# Patient Record
Sex: Female | Born: 1985 | Race: White | Hispanic: No | Marital: Single | Smoking: Current every day smoker
Health system: Southern US, Community
[De-identification: ages and names within clinical notes are randomized; demographics above are authoritative.]

## PROBLEM LIST (undated history)

## (undated) DIAGNOSIS — N83209 Unspecified ovarian cyst, unspecified side: Secondary | ICD-10-CM

## (undated) DIAGNOSIS — G43909 Migraine, unspecified, not intractable, without status migrainosus: Secondary | ICD-10-CM

---

## 2007-05-30 ENCOUNTER — Emergency Department (HOSPITAL_COMMUNITY): Admission: EM | Admit: 2007-05-30 | Discharge: 2007-05-31 | Payer: Self-pay | Admitting: Emergency Medicine

## 2008-07-29 ENCOUNTER — Emergency Department (HOSPITAL_COMMUNITY): Admission: EM | Admit: 2008-07-29 | Discharge: 2008-07-29 | Payer: Self-pay | Admitting: Emergency Medicine

## 2008-08-01 ENCOUNTER — Encounter: Payer: Self-pay | Admitting: Emergency Medicine

## 2008-08-01 ENCOUNTER — Inpatient Hospital Stay (HOSPITAL_COMMUNITY): Admission: EM | Admit: 2008-08-01 | Discharge: 2008-08-04 | Payer: Self-pay | Admitting: Otolaryngology

## 2008-10-19 ENCOUNTER — Emergency Department (HOSPITAL_COMMUNITY): Admission: EM | Admit: 2008-10-19 | Discharge: 2008-10-19 | Payer: Self-pay | Admitting: Emergency Medicine

## 2010-05-08 LAB — CBC
Hemoglobin: 14.5 g/dL (ref 12.0–15.0)
MCHC: 34.4 g/dL (ref 30.0–36.0)
MCV: 88.7 fL (ref 78.0–100.0)
RBC: 4.74 MIL/uL (ref 3.87–5.11)
RDW: 13 % (ref 11.5–15.5)

## 2010-05-08 LAB — DIFFERENTIAL
Basophils Absolute: 0 10*3/uL (ref 0.0–0.1)
Basophils Relative: 0 % (ref 0–1)
Eosinophils Absolute: 0 10*3/uL (ref 0.0–0.7)
Monocytes Absolute: 1.3 10*3/uL — ABNORMAL HIGH (ref 0.1–1.0)
Monocytes Relative: 8 % (ref 3–12)

## 2010-05-08 LAB — POCT I-STAT, CHEM 8
Calcium, Ion: 1.12 mmol/L (ref 1.12–1.32)
Glucose, Bld: 91 mg/dL (ref 70–99)
HCT: 43 % (ref 36.0–46.0)
Hemoglobin: 14.6 g/dL (ref 12.0–15.0)
TCO2: 23 mmol/L (ref 0–100)

## 2010-05-08 LAB — RAPID URINE DRUG SCREEN, HOSP PERFORMED
Barbiturates: NOT DETECTED
Benzodiazepines: NOT DETECTED

## 2010-05-09 LAB — RAPID STREP SCREEN (MED CTR MEBANE ONLY): Streptococcus, Group A Screen (Direct): POSITIVE — AB

## 2010-06-14 NOTE — Group Therapy Note (Signed)
NAME:  Diane Fletcher, Diane Fletcher             ACCOUNT NO.:  [id number]   MEDICAL RECORD NO.:  [id number]          PATIENT TYPE:  INP   LOCATION:  5148                         FACILITY:  [facility]   PHYSICIAN:  Dorna Leitz, M.D., F.A.C.S.DATE OF BIRTH:  Jan 03, 1986                                 PROGRESS NOTE   SUBSEQUENT HOSPITAL CARE   Diagnosis:  1.) Acute strep tonsillitis with left peritonsillar abscess/phlegmon  2.) Acute anxiety disorder  3.) Alcohol abuse  4.) Tobacco abuse  5.) Abnormal auditory perception with significant family history of  hearing loss  6.) Significant social issues with court date pending   INTERVAL HISTORY/>  The patient demonstrated sudden onset of anxiety, panic, and possible  claustrophobia in the room on a couple of occasions earlier today.  This  resulted when she was told that she could not walk off the floor.  She  was further informed at my direction that this would require her leaving  against medical advice.  At some point, she opened her window to put up  the storm window on the outside.  She then put her head out the window,  but denies any suicidal ideation or any plan to jump out the window.  She then put the screen down.  She reports that she just needed fresh  air and sunshine.  She denies any depression.  She is waiting on her  father and friends visit and denies any wish to smoke tobacco, but would  like to go out in the air.   She reports decrease in her sore throat, dysphagia, and a decrease in  tenderness of the left zone II enlarged lymph node.   PHYSICAL EXAMINATION:  Vital signs, July 4, 2:00 p.m., temperature 98.5, patient 82,  respirations 20, blood pressure 125/70, O2 sat 99% on room air and that  was just upon the initial physical exam.  Initially appeared angry, but  then tearful at the end of the discussion about her issues by me.  She  demonstrates a normal voice.  No hot potato voice.  Nose, no exudate.  Oral cavity soft palate  on left with decreased edema - palpation  revealed very soft and nonfluctuant and nonfirm soft palate. The left  tonsil is diminished in size and more easily visualized.  No exudates  are present on either tonsil.  Left tonsil 3+ without erythema, it  demonstrates a large superior left tonsillar crypt.  The right tonsil is  1+ and normal.  The uvula is midline where as it was deviated to the  right last night.  The neck is without any adenopathy or tenderness,  which is improved from last night.   ASSESSMENT/PLAN:  1. Acute strep tonsillitis with left peritonsillar abscess (phlegmon)      and left reactive lymphadenitis, zone II.  This condition is much      improved, and continue Unasyn through day 2 with plan to discharge.  2. Up in chair.  Plan to ambulate off the floor with company.  Advised      not to smoke.  3. No evidence of suicidal ideation.  For  panic and anxiety response,      advised that she may have Ativan upon requesting also for insomnia.      The patient not interested in this medication at the present time.      She was advised not to open the window or place the bottle apart      outside the window.  4. Discharged after Audiology evaluation in the morning for underlying      hearing loss and significant family history of hearing loss.      Further, social worker assistance for services such as primary      physician care and other.  5. Nicoderm patch removed secondary to patient's concern with skin      burning.  She denies feeling nicotine withdrawal symptoms and does      not wish for any replacement at this time.  She was advised to ask      for nicotine gum if withdrawal starts.      Dorna Leitz, M.D., F.A.C.S.  Electronically Signed     SJ/MEDQ  D:  08/02/2008  T:  08/03/2008  Job:  [id number]   cc:   Dr. Sallee Lange  [facility]  Department of Otolaryngology

## 2010-06-14 NOTE — H&P (Signed)
NAME:  Diane Fletcher, Diane Fletcher             ACCOUNT NO.:  [id number]   MEDICAL RECORD NO.:  [id number]          PATIENT TYPE:  EMS   LOCATION:  ED                           FACILITY:  [facility]   PHYSICIAN:  Dorna Leitz, M.D., F.A.C.S.DATE OF BIRTH:  [birth year]   DATE OF ADMISSION:  08/01/2008  DATE OF DISCHARGE:                              HISTORY & PHYSICAL   REASON FOR ADMISSION:  Acute tonsillitis, peritonsillar  abscess/phlegmon, dehydration, acute lymphadenitis.   HISTORY:  This patient is a 25 year old female with a 4 day history of a  sore throat which has been more significant on the left but still is  present on the right.  She was evaluated in the emergency room on July 29, 2008, and found to be Rapid Strep positive.  She was treated with  1.2 million units of IM Bicillin-LA.  She was given hydrocodone with  instructions and information regarding reasons for return (indication of  progressive infection).  She returns this morning to [facility]  emergency room about 6 a.m. by private transport with development over  the night with increasing dysphagia for own secretions, odynophagia, new  development of hoarseness/gravelly voice and left infra-auricular  tenderness but no definite left otalgia.  There is also tightening of  the muscles of mastication (by her indication) which she notices with  opening her mouth.  This is a new development.  She is still able to  open her mouth to normal limits.  She denies dental pain although she  did have two left mandibular molars extracted at some point in the not  too distant past.  There is no facial pain, however, she does notice  some nasal what is described as velopharyngeal insufficiency with  regurgitation of liquids out her nose after p.o. intake of fluids.  This  has developed over the past 24 hours.  She notices this occurrence after  the fluid goes down to the mid throat level and then comes back out of  her nose.  She has to lean  forward for expression of this fluid out of  her nose.  There was no solid or soft intake and has not been any for  the last 3 days.  There is a significant decrease in fluid intake over  the past 2 days.  There is no definite fever but she does report chills  at night.  She denies any previous history of recurrent strep or  otherwise tonsillitis or history of peritonsillar abscess.  There are no  sick contacts.  No abdominal pain, nausea or vomiting.  She denies  shortness of breath or heart palpitations though she did experience  about 5 minutes of left sterno-costal tenderness this a.m. which  resolved quickly.  This is her only occurrence of this symptom.  No  wheezing.   PAST MEDICAL HISTORY:  1. Questionable recent history of left ovarian mass - she was seen      in the emergency room on May 30, 2008.  No followup has occurred.      She reports trying to get pregnant for the last 2 years  with her      boyfriend but is not able to do so.  She reports that she was told      that she could not get pregnant.  She was diagnosed with      Trichomonas vaginitis in the past.  2. History of migraines - increased while in school but now      experiences approximately 1 per month or 1 every other month.  She      does have a strong family history in both her mother and brother.  3. No prior hospitalizations and otherwise healthy.   PAST SURGICAL HISTORY:  None known.   ALLERGIES:  No known drug allergies.   MEDICATIONS:  Lortab elixir, regular saline gargles.   SOCIAL HISTORY:  1. Positive tobacco 1 pack per day for the last 7 years (started age      51).  2. Positive marijuana since 25 years of age.  She uses once daily      since she began using.  Reports stopping a few days ago.  3. Alcohol use - one fifth of gin per week and about 12 packs of beer      per week for many years.  She goes out at night and drinks      approximately 2 nights per week with her girlfriends.  4. She has  a history of positive intranasal cocaine use which she      reports using for about 1 year between ages 46 and 61.  She denies      any prior use of IV drug abuse.  5. Sexual history.  She has an ex-boyfriend which she experiences      unprotected sex.  The last intercourse was 1 week ago.  She has      been trying to get pregnant with him.  She reports approximately 10      sexual partners in her life and having some unprotected sex with      others.  6. Education.  Dropped out of high school in 9th grade.  She is      planning to get her GED from [employer] - she plans to start in the fall.      She is proceeding with getting GED through [employer] who      will assist her in post GED employment.  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  7. Other history.  She moved from [place], [place], 4-5 years ago.  She      lives with her brother, sister and father.  She has no children and      has never been married.  Recent stressor involved a brother who has      a 74-week-old infant who is new to the house - she reports that this      is why she dropped out of her planned first quarter at [employer] in the      GED program.  8. She is unemployed.  When she tested positive for Trichomonas about      2 years ago she reports also getting full labs for sexually      transmitted diseases.  She knows that she tested negative for      syphilis, HIV and other pathogens.  9. Current problems with the legal system - March 11, 2008, she was      connected with a questionable purchase of stolen goods from a pawn  shop in [place].  She spent 1 month in the county jail followed      by 1 month in [facility].  She was discharged      without parole restrictions and has completed her incarceration.      Police action at a hotel 1 month ago when she was involved in      illegal act.  She reports not knowing that she would be involved in      this.  She was involved in girl-girl sex  act with men observation.      She further notes that the police crashed into the room with arrest      and she was not aware of this being illegal event.  She is      scheduled for a court hearing on August 19, 2008.   FAMILY HISTORY:  Mother alive and well at 77 years of age and suffers  from migraines.  Father alive at 44 years of age, family history of  deafness.  Sister age 35 has had heavy periods but no known history of  hemophilia or bleeding disorder in the family.  Her sister is deaf in  one ear which she reports occurred as a young child around 30 years of  age as a result of a severe ear infection.  Her brother is 71 years old  and suffers from severe migraines.  No known hearing loss.   HEARING LOSS HISTORY:  Paternal grandmother and grandfather are both  deaf.  She reports that relatives on her father's side were born deaf  and mute.  Her parents have normal hearing.  Her other individuals on  the paternal side also have problems with hearing.   REVIEW OF SYSTEMS:  ENT:  As per HPI.  No fever, no known bleeding or  bruising history/hemophilia though the patient is with intermittent  heavy periods.  She has positive several year history of difficulty  hearing.  Best hearing is noted looking at those speaking to her.  She  denies dizziness or progression in hearing loss which has been present  for several years.  No history of otitis media in her life nor any  history of loud noise exposure.  Denies shooting a gun.  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  Her hearing  does not get worse when she is in crowds.  JOINT SYSTEM:  She reports a  1 month history of bilateral severe foot pain which limits her standing  to about 2 minutes and then she has to sit down due to the pain.  She  denies numbness or tingling.  No lower extremity swelling or leg pain.  No calf pain.  She also reports a 4 year history of back pain which is  chronic and of unknown  cause.  There is no history of injury.  She has  never sought evaluation for this symptom.  There is no vision change or  problems with her vision.  ALLERGIES:  No history of allergy or nasal  complaints or drainage.  SKIN:  No lesions or rashes.  Tattoo on lower  back and fingers.  CARDIAC:  No heart palpitations or chest pain.  PULMONARY:  No breathing problems, no past or current history of  wheezing.  No development of wheezing or shortness of breath with this  illness.  There has never been any problem getting the air  in/inspiration.  ABDOMINAL:  No  abdominal symptoms except hunger.  GYN:  No current gyn problems except of failure to conceive which she has been  trying to do for 2 years, questionable history of endometriosis.  Positive cyst/mass on the left side.  History of heavy menstrual  periods.  No heartburn, stomach problems or baseline dysphagia.  PSYCHOLOGIC:  No depression or anxiety.  Recent problems with the legal  system.   PHYSICAL EXAMINATION:  VITAL SIGNS:  Temperature 97.8, pulse 96,  respirations 20, blood pressure 135/91, 99% oxygen saturation on room  air.  CONSTITUTIONAL:  My evaluation was after 10 mg of Decadron.  She was  having difficulty swallowing her own secretions and had muffled voice  which is no longer present.  She reports that when she came in her pain  was 10+/10 but is currently 5/10.  She thinks that she can drink and is  feeling much better but there is left upper neck discomfort to  palpation.  EYES:  Extraocular movements are intact.  Pupils equal, round and  reactive to light.  No lid edema or erythema, no scleral erythema.  NOSE:  Clear without exudates.  No erythema of the nasal dorsum or ala.  ORAL CAVITY:  Left tonsil enlarged to midline with moderate erythema but  no exudates.  Moderate soft palate edema which is soft to palpation  except over the inferior tonsil.  Moderate palate prolapse and coverage  over the left tonsil but not  complete.  There is slight shift of the  uvula to the right.  The right tonsil is 2+ without exudate and very  mild erythema.  She demonstrates a normal voice and handling of  secretions.  There is no trismus present.  There is a 3 cm incisural  space.  Normal voice.  Mild gravelly quality to the voice.  No  inspiratory stridor.  Normal posterior pharyngeal wall which is easily  visualized.  No edema of the posterior pharyngeal wall.  No  cobblestoning.  HEENT:  Curly hair which is pulled back.  Fair overall physical  appearance.  Dull right tympanic membrane with questionable fluid.  Nontransparent.  Tympanic membrane which is slightly retracted.  No  granulation tissue or retraction.  No cerumen in the ear canal.  Left  ear tympanic membrane opaque but no definite fluid.  No retraction.  NECK:  Tender left zone 2 angle of the mandible, diffuse enlargement but  no overlying skin change.  This area is 2 cm.  No discrete adenopathy.  No thyromegaly.  No thyroid masses or nodules appreciated.  No stridor,  no carotid bruits, carotids very difficult to auscultate.  CARDIAC:  A fairly audible murmur with obliteration of S1.  This is a  systolic ejection murmur grade 1.  There is a normal rate which is  approximately 90.  The very mild murmur is loudest over the aortic and  mitral valves.  LUNGS:  Clear to auscultation.  No wheezes, rhonchi.  All fields clear.  ABDOMEN:  Obese, soft without hepatosplenomegaly to palpation.  No  rebound.  No tenderness.  Positive bowel sounds without hypo or hyper  activity.  EXTREMITIES:  Normal pulses x4.  No edema, no cyanosis.  Good muscle  development and strength.  No calf edema.  No foot erythema or  abnormality.  NEUROLOGICAL:  Cranial nerves II-XII intact.  Eighth nerve - responsive  normally to normal voice levels.  Cerebellar  - normal gait.   LABORATORY STUDIES:  White blood cell count 16.6 compared  to 14.3 on  July 29, 2008.  Hemoglobin 14.5,  platelets 248, without segmental white  blood cells 82% without bands present.  No atypical lymphocytes.  Sodium  136, potassium 3.7, chloride 102, BUN 23, creatinine 0.8 and glucose 91.  Rapid Strep today negative but was positive on June 30.  CT scan with  contrast of the neck and upper chest including lung fields.  This was  personally reviewed by me today.  Findings include 1) 15.6 x 8.3 mm left  inferomedial tonsil and peritonsillar phlegmon.  The edema and  enlargement extends to the left piriform sinus and larynx.  It involves  the retropharynx mildly.  No definite abscess or phlegmon in the  retropharynx; 2) 19.9 left zone 2 node without lucency but moderate  enlargement; 3) there is a second area of lucency approximately 1 cm on  the left medial surface against the mandibular body.  This is consistent  with a small phlegmon.  There is no ring enhancement of either low  density area and this reveals medium density and not low density.  The  findings are not quite consistent of full liquid abscess.   ASSESSMENT:  1. Strep/nonstrep tonsillitis with moderately progressed left      peritonsillar abscess/phlegmon.  This has progressed despite      Bicillin 4 days prior.  The symptoms and findings are not quite      indicative of a full abscess.  However, there is an elevated white      blood cell count.  She has recently responded to steroids this      morning.  2. Reactive left zone 2 lymphadenitis.  3. Dehydration.  4. Significant other social and medical management needs and issues.  5. Alcohol, tobacco, cannabis abuse, cannabis use in the past.  No IV      drug abuse.  6. Paternal grandparent and the family on this side deafness and have      been born mute.  The patient with subjective long term hearing      loss.  She will need further evaluation and workup.  7. Lacks primary and gyn physician care.   RECOMMENDATIONS:  1. Admit and treat as well as observation for  resolution/progression      of current tonsillar process.  She already has a normal voice and      fluid intake after Decadron.  She will be admitted to the [facility] Emergency Dept surgical/ENT floor 5500.  She will be transported by [facility]      with normal monitoring.  2. IV Unasyn for broader coverage if there are possible other      pathogenic organisms/bacteria present.  Possible repeat of Decadron     if necessary.  3. Hydration.  4. Saline gargles land Peridex gargles.  5. Tap into general care physicians, physician plans and clinic.  6. Smoking cessation.  7. Plan for hearing evaluation and workup.  8. Pain control.  9. One hour spent in counseling and evaluation.  There is a moderate      decision making level in this history and physical.  Brief ER note      dictated [id number].      Dorna Leitz, M.D., F.A.C.S.  Electronically Signed     SJ/MEDQ  D:  08/01/2008  T:  08/01/2008  Job:  [id number]   cc:   Jacalyn Lefevre  Fax: [phone]

## 2010-06-14 NOTE — Consult Note (Signed)
NAME:  Diane Fletcher, Diane Fletcher             ACCOUNT NO.:  [id number]   MEDICAL RECORD NO.:  [id number]          PATIENT TYPE:  EMS   LOCATION:  ED                           FACILITY:  [facility]   PHYSICIAN:  Dorna Leitz, M.D., F.A.C.S.DATE OF BIRTH:  01/24/1986   DATE OF CONSULTATION:  08/01/2008  DATE OF DISCHARGE:                                 CONSULTATION   TIME OF CONSULTATION:  11:00 a.m.   REASON FOR CONSULTATION:  Peritonsillar abscess.   Consultation on this 25 year old female for CT proven peritonsillar  abscess and recent Strep positive rapid test requested.  Patient found  by me to require admission and for this reason, history and physical  dictated.  Full note written and dictated.  Transfer to [facility]  Surgical Floor as all Otolaryngology Surgical services/OR at [facility]  OR.      Dorna Leitz, M.D., F.A.C.S.  Electronically Signed     SJ/MEDQ  D:  08/01/2008  T:  08/01/2008  Job:  [id number]   cc:   Jacalyn Lefevre  Fax: [phone]

## 2010-06-14 NOTE — Discharge Summary (Signed)
NAME:  Diane Fletcher, Diane Fletcher             ACCOUNT NO.:  [id number]   MEDICAL RECORD NO.:  [id number]          PATIENT TYPE:  INP   LOCATION:  5148                         FACILITY:  [facility]   PHYSICIAN:  Dorna Leitz, M.D., F.A.C.S.DATE OF BIRTH:  01-Mar-1985   DATE OF ADMISSION:  08/01/2008  DATE OF DISCHARGE:  08/04/2008                               DISCHARGE SUMMARY   DISCHAGE SUMMARY   TESTS PERFORMED:  1. CT scan of the neck with contrast.  2. Audiogram with tympanometry.   DISCHARGE DIAGNOSES:  1. Acute strep tonsillitis with left peritonsillar abscess.  2. Reactive lymphadenitis.  3. Dehydration.  4. Alcohol abuse and problems with the law.  5. Mental health issues.  6. Social issues.  7. 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   HISTORY:  This patient is a 25 year old female who was admitted 3 days  ago with a few-day history of dysphasia for wound secretions and  significant sore throat.  She was seen in the Emergency Room at [facility] on July 29, 2008 with a strep test being positive.  She was treated  with 1.2 million units of IM Bicillin L-A.  She was given hydrocodone  with instructions and information regarding reasons for return.  She  returned at the day of admission with development of hoarseness as well  as the other symptoms.  She also had tenderness and swelling in the left  infra-auricular region.  There was no definite otalgia and mild trismus.  She also noticed a tightening of the muscle on mastication when she  notices with opening up her mouth.  She had had no significant intake of  fluids for the past 24 hours.  No solid food intake for 3 days and  decrease fluid intake for 2 days.  No definite fever.   On physical exam, temperature was 97.8 and she had mild garbled speech.  The oral cavity demonstrated a left tonsil that was over to the midline  with moderate erythema, but no exudates.  There was moderate soft palate  edema,  which was nonfluctuant.  It was soft over the tonsil.  Moderate  palate prolapse on the left side.  Slight shifted uvula to the right.  Right tonsil 2+.  There was a normal posterior pharyngeal wall.  The  left neck revealed a diffuse 2-cm area of zone 2 fullness with  tenderness, but no adenopathy.  White blood cell count was 16,600 with  80% segs, but no bands.  Her other lab tests were normal.  CT scan of  the neck and upper chest with lung fields was performed with contrast.  There was a left inferior medial tonsillar/peritonsillar phlegmon which  extended to the piriform sinuses of the larynx.  It involves  retropharynx.  The size was 15.6 x 8.3 mm.  The left zone 2 lymph node  measured at 19.9 mm by CT scan.  There was no lucency.  There was no  ring enhancement or significant low density.  For these reasons, the  patient was transferred to the ENT portion of the surgical floor at  [facility] for  ongoing otolaryngology management.  She was treated with  IV Unasyn until discharge.  Pain control was achieved with small amounts  of morphine and oxycodone.  She was hydrated.  While here, she was  evaluated by the social worker and case Facilities manager who were  working to meet her social medical needs.  She will be scheduled for an  appointment with family practice to assess the ovarian issue and also  general mental and other physical health concerns.  There was also a  condition of deafness in the grandparents on her father side.  The  patient demonstrates some mild-to-moderate sensorineural hearing loss,  it is symmetric.  She will follow up with [facility] as an  appointment has been made for her.  She was arranged for 7 days of  Augmentin 875 b.i.d.  She was given two copies of the audiology  evaluation to take with her.  She was discharged to home on hospital day  #4 in stable condition and feeling much better and appeared much more  psychologically adjusted.       Dorna Leitz, M.D., F.A.C.S.  Electronically Signed     SJ/MEDQ  D:  08/04/2008  T:  08/05/2008  Job:  [id number]   cc:   Jacalyn Lefevre

## 2010-06-14 NOTE — Consult Note (Signed)
NAME:  Diane Fletcher, Diane Fletcher             ACCOUNT NO.:  [id number]   MEDICAL RECORD NO.:  [id number]          PATIENT TYPE:  INP   LOCATION:                               FACILITY:  [facility]   PHYSICIAN:  Dorna Leitz, M.D., F.A.C.S.DATE OF BIRTH:  05-04-1985   DATE OF CONSULTATION:  08/04/2008  DATE OF DISCHARGE:                                 CONSULTATION   SUBSEQUENT HOSPITAL CARE   INTERVAL HISTORY:  The patient is feeling very well today and is excited  about going home.  There has been no fever.  She denies sore throat,  dysphagia, or any ear pain.   PHYSICAL EXAMINATION:  VITAL SIGNS:  Temperature 98.5 degrees F, pulse  76, respirations 18, blood pressure 108/72.  HEENT:  The outer ear is without erythema or draining exudate.  Nose, no  exudates or erythema of the overlying skin.  Oral cavity, tonsils are 2+  and with moderate cryptic change, bilaterally.  No palate edema.  No  uvula edema or shift.  NECK:  Not nontender without palpable adenopathy or mass.   ASSESSMENT/PLAN:  1.) Strep left peritonsillar abscess/phlegmon,  tonsillitis-improved, resolving.  2.) Reactive lymphadenitis-improved, resolving.  3.) Discharge to home.      Dorna Leitz, M.D., F.A.C.S.  Electronically Signed     SJ/MEDQ  D:  08/04/2008  T:  08/05/2008  Job:  [id number]   cc:   Jacalyn Lefevre

## 2010-06-14 NOTE — Consult Note (Signed)
NAME:  Diane Fletcher, Diane Fletcher             ACCOUNT NO.:  [id number]   MEDICAL RECORD NO.:  [id number]          PATIENT TYPE:  INP   LOCATION:  5148                         FACILITY:  [facility]   PHYSICIAN:  Dorna Leitz, M.D., F.A.C.S.DATE OF BIRTH:  08-20-1985   DATE OF CONSULTATION:  DATE OF DISCHARGE:  08/04/2008                                 CONSULTATION   SUBSEQUENT HOSPITAL CARE   INTERVAL HISTORY:  The patient is feeling much better today and more  relaxed after having one dose of Ativan yesterday.  She reports mild  discomfort in the left zone 2 node; however, this is markedly improved.  She is tolerating soft diet well.  We discussed the family history of  alcoholism (mother's side of the family) as well as the patient's  concern with anxiety and possibility of bipolar disorder.  She has seen  a psychologist, which she feels has been helpful with dealing with  significant family issues.  He is no longer at his practice and she has  been waiting for 2 weeks for a call with a new doctor's name.  She also  has ovarian problems and was last seen at an outside hospital in May for  ovarian concerns.  She applied for Medicaid about 2 months ago, but is  still waiting on her response.   PHYSICAL EXAMINATION:  VITAL SIGNS:  Temperature 97.9, pulse 80,  respirations 18, blood pressure 135/81, O2 sat 98% on room air.  GENERAL DESCRIPTION:  She is lying in bed and seems to be relaxed with  appropriate affect and thoughtful insight with openness to the  seriousness of her recent problems with the law and alcohol.  NOSE:  No exudates.  ORAL CAVITY:  Normal voice without drooling.  No hoarseness.  NECK:  No obvious masses or adenopathy.  There is no skin change and no  tenderness to palpation.   ASSESSMENT:  1. Left peritonsillar abscess with strep tonsillitis and reactive      lymphadenitis, resolving with Unasyn IV.  2. Mental health concerns  3. Social issues.  4. 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 Continue Unasyn with discharge in the morning.  2. 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  Referral to      vocational rehab for consideration of binaural hearing aid.  She      was advised that her 2 siblings and father need to undergo      audiograms.  The patient will be scheduled for a genetics      appointment given her history and findings on the audiogram today.  3. Augmentin.  She will need Augmentin at discharge for 7 days.  This      will be 875 mg b.i.d.  This will be provided at no charge by the      hospital.  4. Arrange for [facility] appointment for general health      concerns.  5. Psychological Counseling-Assist her with an appointment with the      group that she was seeing and will need a new psychologist as her's  has left.      Dorna Leitz, M.D., F.A.C.S.  Electronically Signed     SJ/MEDQ  D:  08/04/2008  T:  08/05/2008  Job:  [id number]   cc:   Jacalyn Lefevre

## 2010-07-05 ENCOUNTER — Emergency Department (HOSPITAL_COMMUNITY)
Admission: EM | Admit: 2010-07-05 | Discharge: 2010-07-05 | Disposition: A | Discharge status: Home / Self Care | Payer: Self-pay | Attending: Emergency Medicine | Admitting: Emergency Medicine

## 2010-07-05 DIAGNOSIS — Z4802 Encounter for removal of sutures: Secondary | ICD-10-CM | POA: Insufficient documentation

## 2010-10-25 LAB — CBC
HCT: 42.8
Hemoglobin: 14.7
Platelets: 322
RBC: 5.01
WBC: 14.3 — ABNORMAL HIGH

## 2010-10-25 LAB — URINALYSIS, ROUTINE W REFLEX MICROSCOPIC
Ketones, ur: 15 — AB
Nitrite: NEGATIVE
Specific Gravity, Urine: 1.022
Urobilinogen, UA: 1

## 2010-10-25 LAB — POCT I-STAT, CHEM 8
BUN: 5 — ABNORMAL LOW
Calcium, Ion: 1.07 — ABNORMAL LOW
Chloride: 104
Creatinine, Ser: 1
Glucose, Bld: 97
HCT: 45
Hemoglobin: 15.3 — ABNORMAL HIGH
Potassium: 3.6
Sodium: 138
TCO2: 23

## 2010-10-25 LAB — PREGNANCY, URINE: Preg Test, Ur: NEGATIVE

## 2010-10-25 LAB — DIFFERENTIAL
Eosinophils Relative: 0
Lymphocytes Relative: 17
Lymphs Abs: 2.4
Monocytes Relative: 8

## 2010-10-25 LAB — GC/CHLAMYDIA PROBE AMP, GENITAL
Chlamydia, DNA Probe: POSITIVE — AB
GC Probe Amp, Genital: POSITIVE — AB

## 2010-10-25 LAB — WET PREP, GENITAL

## 2011-06-27 MED ORDER — METRONIDAZOLE 500 MG PO TABS
500 MG | ORAL_TABLET | Freq: Two times a day (BID) | ORAL | Status: AC
Start: 2011-06-27 — End: 2011-07-04

## 2011-06-27 NOTE — Progress Notes (Signed)
Subjective:      Patient ID: Kaitlyn Day is a 26 y.o. female whose mother and MGM had ovarian cancer and both survived.  She has a history of ovarian cysts.  Menarche was age 23 x 27-32 x 7 days.  She gets cramps 4 days before her periods with breast tenderness.  She is getting an itchy discharge around her period for 4 months.  She is sexually active and doesn't use any contraception.  Partner has a 2 yo.  She'd like a pregnancy sooner than later.  She has hirsutism and irregular menses    HPI    Review of Systems   Constitutional: Negative for appetite change, fatigue and unexpected weight change.   HENT: Negative for congestion and rhinorrhea.    Eyes: Negative for visual disturbance.   Respiratory: Positive for cough (related to smoking). Negative for shortness of breath and wheezing.    Cardiovascular: Negative for chest pain and palpitations.   Gastrointestinal: Negative for abdominal pain, diarrhea and constipation.   Genitourinary: Positive for vaginal discharge (thick with itching) and menstrual problem (dysmenorrhea). Negative for dysuria, urgency and frequency.   Musculoskeletal: Positive for back pain (chronic low back). Negative for joint swelling.   Skin: Negative.  Negative for rash.        Hirsutism for 4 years.  She gets acne around her menses on her back   Neurological: Positive for headaches (migraines once weekly). Negative for dizziness.   Hematological: Does not bruise/bleed easily.   Psychiatric/Behavioral: Negative.        Objective:   Physical Exam   Constitutional: She is oriented to person, place, and time. She appears well-developed and well-nourished.   Eyes: Conjunctivae are normal.   Neck: Neck supple.   Cardiovascular: Normal heart sounds.    Pulmonary/Chest: Breath sounds normal.   Abdominal: She exhibits no mass. There is no tenderness.   Genitourinary:   Breasts nipples everted, no masses or tenderness, does BSE  Vulva-no lesions  Vagina-pink rugated  Cervix-firm, 2 cm.  Nontender, freely movable, no lesions  Uterus-ant. Smooth, firm, nontender, freely movable  Adnexa-no masses or tenderness  Pap to lab   Musculoskeletal: Normal range of motion.   Lymphadenopathy:     She has no cervical adenopathy.   Neurological: She is alert and oriented to person, place, and time.   Skin: Skin is warm and dry.   Psychiatric: She has a normal mood and affect. Her behavior is normal.       Assessment:      Screening sti's  Hirsutism  History of ovarian cysts poss PCOS  Exam is normal  Bacterial vaginosis       Plan:      HIV, RPR, LH FSH DHEAS testosterone, glucose, Hgb A1C, insulin  Metronidazole 500#14 1 twice daily  Folic acid daily  Call with test results  Consider Aldactone or Vaniqa for hirsutism   RTC 1 year and PRN

## 2011-06-30 LAB — C. TRACHOMATIS / N. GONORRHOEAE, DNA
C. trachomatis DNA: NEGATIVE
N. gonorrhoeae DNA: NEGATIVE

## 2011-06-30 LAB — GYN CYTOLOGY

## 2011-07-03 NOTE — Telephone Encounter (Signed)
Pt mailed confirmation of neg pap and GC/chlamydia results

## 2012-10-23 ENCOUNTER — Encounter (HOSPITAL_COMMUNITY): Payer: Self-pay | Admitting: *Deleted

## 2012-10-23 DIAGNOSIS — Z8742 Personal history of other diseases of the female genital tract: Secondary | ICD-10-CM | POA: Insufficient documentation

## 2012-10-23 DIAGNOSIS — F17200 Nicotine dependence, unspecified, uncomplicated: Secondary | ICD-10-CM | POA: Insufficient documentation

## 2012-10-23 DIAGNOSIS — G43909 Migraine, unspecified, not intractable, without status migrainosus: Secondary | ICD-10-CM | POA: Insufficient documentation

## 2012-10-23 NOTE — ED Notes (Signed)
Pt states migraine that has lasted for 2 days. Pt states nausea and chills as well. Pt states hx of migraines for the past 3 years.

## 2012-10-24 ENCOUNTER — Emergency Department (HOSPITAL_COMMUNITY)
Admission: EM | Admit: 2012-10-24 | Discharge: 2012-10-24 | Disposition: A | Discharge status: Home / Self Care | Payer: Self-pay | Attending: Emergency Medicine | Admitting: Emergency Medicine

## 2012-10-24 DIAGNOSIS — G43909 Migraine, unspecified, not intractable, without status migrainosus: Secondary | ICD-10-CM

## 2012-10-24 HISTORY — DX: Unspecified ovarian cyst, unspecified side: N83.209

## 2012-10-24 HISTORY — DX: Migraine, unspecified, not intractable, without status migrainosus: G43.909

## 2012-10-24 MED ORDER — DIPHENHYDRAMINE HCL 50 MG/ML IJ SOLN
25.0000 mg | Freq: Once | INTRAMUSCULAR | Status: AC
  Administered 2012-10-24: 25 mg via INTRAVENOUS
  Filled 2012-10-24: qty 1

## 2012-10-24 MED ORDER — KETOROLAC TROMETHAMINE 30 MG/ML IJ SOLN
30.0000 mg | Freq: Once | INTRAMUSCULAR | Status: AC
  Administered 2012-10-24: 30 mg via INTRAVENOUS
  Filled 2012-10-24: qty 1

## 2012-10-24 MED ORDER — METOCLOPRAMIDE HCL 5 MG/ML IJ SOLN
10.0000 mg | Freq: Once | INTRAMUSCULAR | Status: AC
  Administered 2012-10-24: 10 mg via INTRAVENOUS
  Filled 2012-10-24: qty 2

## 2012-10-24 MED ORDER — SODIUM CHLORIDE 0.9 % IV BOLUS (SEPSIS)
1000.0000 mL | Freq: Once | INTRAVENOUS | Status: AC
  Administered 2012-10-24: 1000 mL via INTRAVENOUS

## 2012-10-24 NOTE — ED Notes (Signed)
Symptoms include photophobia, pain from loud noises

## 2012-10-24 NOTE — ED Provider Notes (Signed)
CSN: [id number]     Arrival date & time 10/23/12  2217 History   First MD Initiated Contact with Patient 10/24/12 0107     Chief Complaint  Patient presents with  . Headache   (Consider location/radiation/quality/duration/timing/severity/associated sxs/prior Treatment) The history is provided by the patient. No language interpreter was used.  Diane Fletcher is a 27 y/o F with PMHx of migraines presenting to the ED with a headache that started this morning. Patient reported that the headache started off subtle and continued to get worse as the day progressed. Patient reported that she has a constant throbbing sensation to the head, entire head. Reported that she has been having this sensation that she her head feels "really big" reported that there is a "bloating" sensation to her head. Patient reported that the discomfort stays to the head without radiation. Reported that light and loud sounds make the pain worse. Patient reported that nothing makes the pain better. Patient reported that she took ASA earlier today without relief. Patient stated that she has been feeling nauseous reported that she had at least 3 episodes of emesis today - NB/NB. Patient reported that the symptoms are very similar to the symptoms that she normally gets with her migraines - reported that the symptoms that she is presenting with are the normal symptoms that she gets with her normal migraines. Reported that she gets intermittent blurred vision depending on how intense the headache is. Patient reports that she gets migraines at least once per week. Reports that she's not on any maintenance therapy. Denied worst headache of her life, thunderclap onset, numbness, tingling, weakness, chest pain, shortness of breath, difficulty breathing, sudden loss of vision, neck stiffness, fever, neck pain. PCP none  Past Medical History  Diagnosis Date  . Migraines   . Ovarian cyst    History reviewed. No pertinent past surgical  history. History reviewed. No pertinent family history. History  Substance Use Topics  . Smoking status: Current Every Day Smoker  . Smokeless tobacco: Not on file  . Alcohol Use: No   OB History   Grav Para Term Preterm Abortions TAB SAB Ect Mult Living                 Review of Systems  Constitutional: Negative for fever.  HENT: Negative for sore throat, trouble swallowing, neck pain and neck stiffness.   Eyes: Positive for photophobia and visual disturbance.  Respiratory: Negative for chest tightness and shortness of breath.   Cardiovascular: Negative for chest pain.  Gastrointestinal: Positive for nausea and vomiting. Negative for abdominal pain.  Neurological: Positive for headaches. Negative for numbness.  All other systems reviewed and are negative.    Allergies  Review of patient's allergies indicates no known allergies.  Home Medications  No current outpatient prescriptions on file. BP 118/74  Pulse 80  Temp(Src) 97.3 F (36.3 C) (Oral)  Resp 18  Ht 5\' 4"  (1.626 m)  Wt 200 lb (90.719 kg)  BMI 34.31 kg/m2  SpO2 97% Physical Exam  Nursing note and vitals reviewed. Constitutional: She is oriented to person, place, and time. She appears well-developed and well-nourished. No distress.  Patient found laying in bed sleeping  HENT:  Head: Normocephalic and atraumatic.  Mouth/Throat: Oropharynx is clear and moist. No oropharyngeal exudate.  Eyes: Conjunctivae and EOM are normal. Pupils are equal, round, and reactive to light. Right eye exhibits no discharge. Left eye exhibits no discharge.  Neck: Normal range of motion. Neck supple.  Negative neck stiffness  Negative nuchal rigidity Negative lymphadenopathy Negative pain upon palpation to the neck or cervical spine  Cardiovascular: Normal rate, regular rhythm and normal heart sounds.  Exam reveals no friction rub.   No murmur heard. Pulses:      Radial pulses are 2+ on the right side, and 2+ on the left side.        Dorsalis pedis pulses are 2+ on the right side, and 2+ on the left side.  Pulmonary/Chest: Effort normal and breath sounds normal. No respiratory distress. She has no wheezes. She has no rales.  Musculoskeletal: Normal range of motion.  Lymphadenopathy:    She has no cervical adenopathy.  Neurological: She is alert and oriented to person, place, and time. No cranial nerve deficit. She exhibits normal muscle tone. Coordination normal.  Cranial nerves II through XII grossly intact Strength 5+/5+ upper and lower extremities bilaterally, with resistance applied, equal distribution identified Negative arm drift Answered questions accordingly Follows commands well  Skin: Skin is warm and dry. She is not diaphoretic. No erythema.  Psychiatric: She has a normal mood and affect. Her behavior is normal. Thought content normal.    ED Course  Procedures (including critical care time)  4:20 AM Spoke with patient, re-assessed patient reported that headache feels a lot better. Patient reported that she is ready to go home.   Labs Review Labs Reviewed - No data to display Imaging Review No results found.  MDM   1. Migraine     Patient presenting to the emergency room with migraine that started this morning-reported that she's been having migraines for the past 3 years, reported that she gets a least one per week. When discussed about maintenance therapy patient denied being on any type of maintenance therapy. Patient reports that she usually gets nausea, vomiting, visual disturbances such as blurred vision, photophobia, phonophobia and a throbbing sensation all over her head-reported that these are her normal symptoms for migraine. Patient reports she presenting today with normal symptoms were migraine. Denies sudden onset, worse headache of life. Alert and oriented. Patient pleasant. Negative neurological deficits identified. Heart rate and rhythm normal. Lungs clear to auscultation bilaterally.  Pulses palpable and strong, proximal and distal. Full range of motion to upper lower extremities bilaterally. Strength intact. Doubt SAH. Doubt intracranial hemorrhage. Doubt intracranial abnormalities or injuries. Suspicion to be typical migraine that the patient presents with. Pain controlled in ED setting. Patient reports she is feeling better after IV fluids, Toradol, Benadryl, Reglan have been given. Patient stable, afebrile. Discharged patient with instructions to use Excedrin as needed for discomfort and for migraine relief. Discussed with patient that since these migraines are so frequent that she may need to followup regarding possible maintenance therapy. Discussed with patient to rest and stay hydrated. [facility]. Discussed with patient to continue to monitor symptoms and if symptoms are to worsen or change to report back to emergency department-strict return instructions given. Patient agreed plan of care, understood, all questions answered.    Raymon Mutton, PA-C 10/24/12 [phone]

## 2012-10-25 NOTE — ED Provider Notes (Signed)
Medical screening examination/treatment/procedure(s) were performed by non-physician practitioner and as supervising physician I was immediately available for consultation/collaboration.    Mayreli Alden R Kambree Krauss, MD 10/25/12 0340 

## 2013-04-08 ENCOUNTER — Emergency Department (HOSPITAL_COMMUNITY)
Admission: EM | Admit: 2013-04-08 | Discharge: 2013-04-08 | Disposition: A | Discharge status: Home / Self Care | Payer: Self-pay | Attending: Emergency Medicine | Admitting: Emergency Medicine

## 2013-04-08 ENCOUNTER — Encounter (HOSPITAL_COMMUNITY): Payer: Self-pay | Admitting: Emergency Medicine

## 2013-04-08 DIAGNOSIS — F17200 Nicotine dependence, unspecified, uncomplicated: Secondary | ICD-10-CM | POA: Insufficient documentation

## 2013-04-08 DIAGNOSIS — R112 Nausea with vomiting, unspecified: Secondary | ICD-10-CM | POA: Insufficient documentation

## 2013-04-08 DIAGNOSIS — H53149 Visual discomfort, unspecified: Secondary | ICD-10-CM | POA: Insufficient documentation

## 2013-04-08 DIAGNOSIS — G43909 Migraine, unspecified, not intractable, without status migrainosus: Secondary | ICD-10-CM | POA: Insufficient documentation

## 2013-04-08 LAB — CBC WITH DIFFERENTIAL/PLATELET
BASOS ABS: 0 10*3/uL (ref 0.0–0.1)
BASOS PCT: 0 % (ref 0–1)
EOS ABS: 0 10*3/uL (ref 0.0–0.7)
EOS PCT: 0 % (ref 0–5)
HCT: 37.8 % (ref 36.0–46.0)
Hemoglobin: 12.9 g/dL (ref 12.0–15.0)
LYMPHS ABS: 1.8 10*3/uL (ref 0.7–4.0)
Lymphocytes Relative: 16 % (ref 12–46)
MCH: 29.9 pg (ref 26.0–34.0)
MCHC: 34.1 g/dL (ref 30.0–36.0)
MCV: 87.5 fL (ref 78.0–100.0)
Monocytes Absolute: 0.7 10*3/uL (ref 0.1–1.0)
Monocytes Relative: 6 % (ref 3–12)
NEUTROS PCT: 78 % — AB (ref 43–77)
Neutro Abs: 8.8 10*3/uL — ABNORMAL HIGH (ref 1.7–7.7)
PLATELETS: 280 10*3/uL (ref 150–400)
RBC: 4.32 MIL/uL (ref 3.87–5.11)
RDW: 13.1 % (ref 11.5–15.5)
WBC: 11.3 10*3/uL — ABNORMAL HIGH (ref 4.0–10.5)

## 2013-04-08 LAB — BASIC METABOLIC PANEL
BUN: 7 mg/dL (ref 6–23)
CALCIUM: 9.2 mg/dL (ref 8.4–10.5)
CO2: 25 mEq/L (ref 19–32)
Chloride: 105 mEq/L (ref 96–112)
Creatinine, Ser: 0.57 mg/dL (ref 0.50–1.10)
GLUCOSE: 104 mg/dL — AB (ref 70–99)
POTASSIUM: 3.7 meq/L (ref 3.7–5.3)
SODIUM: 141 meq/L (ref 137–147)

## 2013-04-08 MED ORDER — SODIUM CHLORIDE 0.9 % IV BOLUS (SEPSIS)
1000.0000 mL | Freq: Once | INTRAVENOUS | Status: AC
  Administered 2013-04-08: 1000 mL via INTRAVENOUS

## 2013-04-08 MED ORDER — KETOROLAC TROMETHAMINE 15 MG/ML IJ SOLN
30.0000 mg | Freq: Once | INTRAMUSCULAR | Status: AC
  Administered 2013-04-08: 30 mg via INTRAVENOUS
  Filled 2013-04-08: qty 2

## 2013-04-08 MED ORDER — METOCLOPRAMIDE HCL 5 MG/ML IJ SOLN
10.0000 mg | Freq: Once | INTRAMUSCULAR | Status: AC
  Administered 2013-04-08: 10 mg via INTRAVENOUS
  Filled 2013-04-08: qty 2

## 2013-04-08 MED ORDER — SUMATRIPTAN SUCCINATE 6 MG/0.5ML ~~LOC~~ SOLN: 6.0000 mg | Freq: Once | SUBCUTANEOUS | Status: DC

## 2013-04-08 MED ORDER — DIPHENHYDRAMINE HCL 50 MG/ML IJ SOLN
25.0000 mg | Freq: Once | INTRAMUSCULAR | Status: AC
  Administered 2013-04-08: 25 mg via INTRAVENOUS
  Filled 2013-04-08: qty 1

## 2013-04-08 NOTE — ED Notes (Signed)
Pt has hx of migraine. New onset that started yesterday and pain continues. Pt reports taking Advil and oxycodone 15 mg with no relief. Pt reports n/vx3, light sensitivity and no energy. Pt alert and oriented.

## 2013-04-08 NOTE — ED Provider Notes (Signed)
CSN: [id number]     Arrival date & time 04/08/13  1910 History   First MD Initiated Contact with Patient 04/08/13 2053     Chief Complaint  Patient presents with  . Migraine     (Consider location/radiation/quality/duration/timing/severity/associated sxs/prior Treatment) Patient is a 28 y.o. female presenting with migraines. The history is provided by the patient. No language interpreter was used.  Migraine This is a new problem. The current episode started yesterday. Associated symptoms include headaches, nausea and vomiting. Pertinent negatives include no abdominal pain, chills, fever, vertigo, visual change or weakness.   Pt is a 28 year old female who presents with a migraine. She reports that she has had gradual worsening of a headache for the last two days. She endorses a history of migraines and says that she sometimes has to come in for fluids and meds. She denies any recent fever, chills or illness. She denies any confusion, weakness, visual changes, dizziness or problems walking. She does report mild photophobia. She has tried advil and oxycodone at home without relief. She reports mild nausea and had one episode of vomiting at home.    Past Medical History  Diagnosis Date  . Migraines   . Ovarian cyst    History reviewed. No pertinent past surgical history. History reviewed. No pertinent family history. History  Substance Use Topics  . Smoking status: Current Every Day Smoker -- 1.00 packs/day    Types: Cigarettes  . Smokeless tobacco: Not on file  . Alcohol Use: No   OB History   Grav Para Term Preterm Abortions TAB SAB Ect Mult Living                 Review of Systems  Constitutional: Negative for fever and chills.  Eyes: Positive for photophobia. Negative for visual disturbance.  Gastrointestinal: Positive for nausea and vomiting. Negative for abdominal pain.  Neurological: Positive for headaches. Negative for vertigo and weakness.  All other systems reviewed  and are negative.      Allergies  Review of patient's allergies indicates no known allergies.  Home Medications   Current Outpatient Rx  Name  Route  Sig  Dispense  Refill  . aspirin-acetaminophen-caffeine (EXCEDRIN MIGRAINE) 250-250-65 MG per tablet   Oral   Take 2 tablets by mouth every 6 (six) hours as needed for headache.         . ibuprofen (ADVIL,MOTRIN) 200 MG tablet   Oral   Take 600 mg by mouth every 6 (six) hours as needed for moderate pain.         . SUMAtriptan (IMITREX) 6 MG/0.5ML SOLN injection   Subcutaneous   Inject 0.5 mLs (6 mg total) into the skin once. May repeat in 2 hours if headache persists or recurs.   0.5 mL   1    BP 119/73  Pulse 88  Temp(Src) 98.2 F (36.8 C) (Oral)  Resp 22  SpO2 98%  LMP 04/05/2013 Physical Exam  Nursing note and vitals reviewed. Constitutional: She is oriented to person, place, and time. She appears well-developed and well-nourished. No distress.  Well-appearing  HENT:  Head: Normocephalic and atraumatic.  Eyes: Conjunctivae and EOM are normal. Pupils are equal, round, and reactive to light.  Neck: Normal range of motion. Neck supple. No JVD present.  Cardiovascular: Normal rate, regular rhythm, normal heart sounds and intact distal pulses.   Pulmonary/Chest: Breath sounds normal. No respiratory distress. She has no wheezes.  Abdominal: Soft. Bowel sounds are normal. She exhibits no distension.  There is no tenderness.  Musculoskeletal: Normal range of motion.  Lymphadenopathy:    She has no cervical adenopathy.  Neurological: She is alert and oriented to person, place, and time. She has normal strength. No cranial nerve deficit. She displays a negative Romberg sign. Coordination and gait normal. GCS eye subscore is 4. GCS verbal subscore is 5. GCS motor subscore is 6.  Skin: Skin is warm and dry. No rash noted.  Psychiatric: She has a normal mood and affect. Her behavior is normal. Judgment and thought content  normal.    ED Course  Procedures (including critical care time) Labs Review Labs Reviewed  CBC WITH DIFFERENTIAL - Abnormal; Notable for the following:    WBC 11.3 (*)    Neutrophils Relative % 78 (*)    Neutro Abs 8.8 (*)    All other components within normal limits  BASIC METABOLIC PANEL - Abnormal; Notable for the following:    Glucose, Bld 104 (*)    All other components within normal limits   Imaging Review No results found.   EKG Interpretation None      MDM   Final diagnoses:  Migraine    Patient reports that she is feeling much better after NS x 1 liter and headache cocktail. She feels like her headache has resolved and she is ready to go home. No neuro deficits or focal weakness. No neck pain or stiffness. No meningeal signs. Afebrile. No recent illness. Reports that this is typical of her regular migraines. Discussed plan of care with pt and she agrees. Go home and rest in a dark quiet room. Return precautions given.      Irish EldersKelly Blaklee Shores, NP 04/15/13 [phone]

## 2013-04-08 NOTE — Discharge Instructions (Signed)
Migraine Headache A migraine headache is an intense, throbbing pain on one or both sides of your head. A migraine can last for 30 minutes to several hours. CAUSES  The exact cause of a migraine headache is not always known. However, a migraine may be caused when nerves in the brain become irritated and release chemicals that cause inflammation. This causes pain. Certain things may also trigger migraines, such as:  Alcohol.  Smoking.  Stress.  Menstruation.  Aged cheeses.  Foods or drinks that contain nitrates, glutamate, aspartame, or tyramine.  Lack of sleep.  Chocolate.  Caffeine.  Hunger.  Physical exertion.  Fatigue.  Medicines used to treat chest pain (nitroglycerine), birth control pills, estrogen, and some blood pressure medicines. SIGNS AND SYMPTOMS  Pain on one or both sides of your head.  Pulsating or throbbing pain.  Severe pain that prevents daily activities.  Pain that is aggravated by any physical activity.  Nausea, vomiting, or both.  Dizziness.  Pain with exposure to bright lights, loud noises, or activity.  General sensitivity to bright lights, loud noises, or smells. Before you get a migraine, you may get warning signs that a migraine is coming (aura). An aura may include:  Seeing flashing lights.  Seeing bright spots, halos, or zig-zag lines.  Having tunnel vision or blurred vision.  Having feelings of numbness or tingling.  Having trouble talking.  Having muscle weakness. DIAGNOSIS  A migraine headache is often diagnosed based on:  Symptoms.  Physical exam.  A CT scan or MRI of your head. These imaging tests cannot diagnose migraines, but they can help rule out other causes of headaches. TREATMENT Medicines may be given for pain and nausea. Medicines can also be given to help prevent recurrent migraines.  HOME CARE INSTRUCTIONS  Only take over-the-counter or prescription medicines for pain or discomfort as directed by your  health care provider. The use of long-term narcotics is not recommended.  Lie down in a dark, quiet room when you have a migraine.  Keep a journal to find out what may trigger your migraine headaches. For example, write down:  What you eat and drink.  How much sleep you get.  Any change to your diet or medicines.  Limit alcohol consumption.  Quit smoking if you smoke.  Get 7 9 hours of sleep, or as recommended by your health care provider.  Limit stress.  Keep lights dim if bright lights bother you and make your migraines worse. SEEK IMMEDIATE MEDICAL CARE IF:   Your migraine becomes severe.  You have a fever.  You have a stiff neck.  You have vision loss.  You have muscular weakness or loss of muscle control.  You start losing your balance or have trouble walking.  You feel faint or pass out.  You have severe symptoms that are different from your first symptoms. MAKE SURE YOU:   Understand these instructions.  Will watch your condition.  Will get help right away if you are not doing well or get worse. Document Released: 01/16/2005 Document Revised: 11/06/2012 Document Reviewed: 09/23/2012 [facility] Patient Information 2014 [employer], [place].  Establish and follow-up with PCP Return if headache worsens Rest Stay well-hydrated

## 2013-04-21 NOTE — ED Provider Notes (Signed)
Medical screening examination/treatment/procedure(s) were performed by non-physician practitioner and as supervising physician I was immediately available for consultation/collaboration.   Lemarcus Baggerly L Jadiel Schmieder, MD 04/21/13 1108 

## 2013-10-23 ENCOUNTER — Encounter (HOSPITAL_COMMUNITY): Payer: Self-pay | Admitting: Emergency Medicine

## 2013-10-23 ENCOUNTER — Emergency Department (HOSPITAL_COMMUNITY)
Admission: EM | Admit: 2013-10-23 | Discharge: 2013-10-23 | Disposition: A | Discharge status: Home / Self Care | Payer: Self-pay | Attending: Emergency Medicine | Admitting: Emergency Medicine

## 2013-10-23 DIAGNOSIS — F17200 Nicotine dependence, unspecified, uncomplicated: Secondary | ICD-10-CM | POA: Insufficient documentation

## 2013-10-23 DIAGNOSIS — G43009 Migraine without aura, not intractable, without status migrainosus: Secondary | ICD-10-CM

## 2013-10-23 DIAGNOSIS — R51 Headache: Secondary | ICD-10-CM | POA: Insufficient documentation

## 2013-10-23 DIAGNOSIS — G43909 Migraine, unspecified, not intractable, without status migrainosus: Secondary | ICD-10-CM | POA: Insufficient documentation

## 2013-10-23 DIAGNOSIS — Z8742 Personal history of other diseases of the female genital tract: Secondary | ICD-10-CM | POA: Insufficient documentation

## 2013-10-23 MED ORDER — IBUPROFEN 800 MG PO TABS: 800.0000 mg | ORAL_TABLET | Freq: Three times a day (TID) | ORAL | Status: DC | PRN

## 2013-10-23 MED ORDER — PROCHLORPERAZINE EDISYLATE 5 MG/ML IJ SOLN
10.0000 mg | Freq: Once | INTRAMUSCULAR | Status: AC
  Administered 2013-10-23: 10 mg via INTRAVENOUS
  Filled 2013-10-23: qty 2

## 2013-10-23 MED ORDER — SODIUM CHLORIDE 0.9 % IV BOLUS (SEPSIS)
1000.0000 mL | Freq: Once | INTRAVENOUS | Status: AC
  Administered 2013-10-23: 1000 mL via INTRAVENOUS

## 2013-10-23 MED ORDER — KETOROLAC TROMETHAMINE 30 MG/ML IJ SOLN
30.0000 mg | Freq: Once | INTRAMUSCULAR | Status: AC
  Administered 2013-10-23: 30 mg via INTRAVENOUS
  Filled 2013-10-23: qty 1

## 2013-10-23 MED ORDER — DIPHENHYDRAMINE HCL 50 MG/ML IJ SOLN
25.0000 mg | Freq: Once | INTRAMUSCULAR | Status: AC
  Administered 2013-10-23: 25 mg via INTRAVENOUS
  Filled 2013-10-23: qty 1

## 2013-10-23 NOTE — ED Provider Notes (Signed)
Medical screening examination/treatment/procedure(s) were performed by non-physician practitioner and as supervising physician I was immediately available for consultation/collaboration.   EKG Interpretation None        Purvis Sheffield, MD 10/23/13 1746

## 2013-10-23 NOTE — ED Notes (Signed)
Pt states she has a headache that started yesterday morning  Pt states she has migraines  Pt states she has nausea and vomiting

## 2013-10-23 NOTE — ED Provider Notes (Signed)
CSN: [id number]     Arrival date & time 10/23/13  0501 History   First MD Initiated Contact with Patient 10/23/13 0630     Chief Complaint  Patient presents with  . Headache     (Consider location/radiation/quality/duration/timing/severity/associated sxs/prior Treatment) HPI Patient presents to the emergency department with migraine headache that started yesterday morning.  The patient, states she did not take any medications for her headache.  Patient describes it as right-sided temporal headache.  Patient does have a history of migraine headaches.  Has also had some nausea with vomiting.  Patient also complains of phonophobia, and photophobia.  Patient denies chest pain, shortness breath, blurred vision, weakness, dizziness, numbness back pain, fever, dysuria, abdominal pain, or syncope.  The patient, states nothing seems make her condition, better Past Medical History  Diagnosis Date  . Migraines   . Ovarian cyst    History reviewed. No pertinent past surgical history. Family History  Problem Relation Age of Onset  . Diabetes Other    History  Substance Use Topics  . Smoking status: Current Every Day Smoker -- 1.00 packs/day    Types: Cigarettes  . Smokeless tobacco: Not on file  . Alcohol Use: No   OB History   Grav Para Term Preterm Abortions TAB SAB Ect Mult Living                 Review of Systems All other systems negative except as documented in the HPI. All pertinent positives and negatives as reviewed in the HPI.   Allergies  Review of patient's allergies indicates no known allergies.  Home Medications   Prior to Admission medications   Medication Sig Start Date End Date Taking? Authorizing Provider  aspirin-acetaminophen-caffeine (EXCEDRIN MIGRAINE) [phone] MG per tablet Take 2 tablets by mouth every 6 (six) hours as needed for headache.   Yes Historical Provider, MD   BP 144/78  Pulse 85  Temp(Src) 97.8 F (36.6 C) (Oral)  Resp 20  SpO2 97%  LMP  10/08/2013 Physical Exam  Nursing note and vitals reviewed. Constitutional: She is oriented to person, place, and time. She appears well-developed and well-nourished. No distress.  HENT:  Head: Normocephalic and atraumatic.  Mouth/Throat: Oropharynx is clear and moist.  Eyes: Pupils are equal, round, and reactive to light.  Neck: Normal range of motion. Neck supple.  Cardiovascular: Normal rate, regular rhythm and normal heart sounds.  Exam reveals no gallop and no friction rub.   No murmur heard. Pulmonary/Chest: Effort normal and breath sounds normal.  Neurological: She is alert and oriented to person, place, and time. She exhibits normal muscle tone. Coordination normal.  Skin: Skin is warm and dry.    ED Course  Procedures (including critical care time)  Patient is improved following IV fluids, Toradol, Compazine and Benadryl.  Patient is advised to followup with her primary care Dr. told to return here as needed  Carlyle Dolly, PA-C 10/23/13 [phone]

## 2013-10-23 NOTE — Discharge Instructions (Signed)
Return here as needed.  Follow-up with a primary care Dr. increase your fluid intake °

## 2013-12-03 ENCOUNTER — Emergency Department (HOSPITAL_COMMUNITY)
Admission: EM | Admit: 2013-12-03 | Discharge: 2013-12-03 | Disposition: A | Discharge status: Home / Self Care | Payer: Self-pay | Attending: Emergency Medicine | Admitting: Emergency Medicine

## 2013-12-03 ENCOUNTER — Encounter (HOSPITAL_COMMUNITY): Payer: Self-pay

## 2013-12-03 DIAGNOSIS — Z8742 Personal history of other diseases of the female genital tract: Secondary | ICD-10-CM | POA: Insufficient documentation

## 2013-12-03 DIAGNOSIS — Z720 Tobacco use: Secondary | ICD-10-CM | POA: Insufficient documentation

## 2013-12-03 DIAGNOSIS — G43009 Migraine without aura, not intractable, without status migrainosus: Secondary | ICD-10-CM

## 2013-12-03 DIAGNOSIS — G43909 Migraine, unspecified, not intractable, without status migrainosus: Secondary | ICD-10-CM | POA: Insufficient documentation

## 2013-12-03 MED ORDER — SODIUM CHLORIDE 0.9 % IV BOLUS (SEPSIS)
1000.0000 mL | Freq: Once | INTRAVENOUS | Status: AC
  Administered 2013-12-03: 1000 mL via INTRAVENOUS

## 2013-12-03 MED ORDER — DEXAMETHASONE SODIUM PHOSPHATE 10 MG/ML IJ SOLN
10.0000 mg | Freq: Once | INTRAMUSCULAR | Status: AC
  Administered 2013-12-03: 10 mg via INTRAVENOUS
  Filled 2013-12-03: qty 1

## 2013-12-03 MED ORDER — METOCLOPRAMIDE HCL 5 MG/ML IJ SOLN
10.0000 mg | Freq: Once | INTRAMUSCULAR | Status: AC
  Administered 2013-12-03: 10 mg via INTRAVENOUS
  Filled 2013-12-03: qty 2

## 2013-12-03 MED ORDER — DIPHENHYDRAMINE HCL 50 MG/ML IJ SOLN
25.0000 mg | Freq: Once | INTRAMUSCULAR | Status: AC
  Administered 2013-12-03: 25 mg via INTRAVENOUS
  Filled 2013-12-03: qty 1

## 2013-12-03 MED ORDER — KETOROLAC TROMETHAMINE 30 MG/ML IJ SOLN
30.0000 mg | Freq: Once | INTRAMUSCULAR | Status: AC
  Administered 2013-12-03: 30 mg via INTRAVENOUS
  Filled 2013-12-03: qty 1

## 2013-12-03 NOTE — ED Notes (Signed)
Per pt, hx of migraines.  Having migraine since this morning.  Not on meds at home for this.  Pt also n/v with sensitivity to light

## 2013-12-03 NOTE — Discharge Instructions (Signed)

## 2013-12-03 NOTE — ED Provider Notes (Signed)
CSN: [id number]     Arrival date & time 12/03/13  1608 History   First MD Initiated Contact with Patient 12/03/13 1717     Chief Complaint  Patient presents with  . Migraine     (Consider location/radiation/quality/duration/timing/severity/associated sxs/prior Treatment) Patient is a 28 y.o. female presenting with migraines. The history is provided by the patient.  Migraine This is a chronic problem. The current episode started 6 to 12 hours ago. The problem occurs constantly. The problem has not changed since onset.Pertinent negatives include no chest pain, no abdominal pain and no shortness of breath. Nothing aggravates the symptoms. Nothing relieves the symptoms.    Past Medical History  Diagnosis Date  . Migraines   . Ovarian cyst    History reviewed. No pertinent past surgical history. Family History  Problem Relation Age of Onset  . Diabetes Other    History  Substance Use Topics  . Smoking status: Current Every Day Smoker -- 1.00 packs/day    Types: Cigarettes  . Smokeless tobacco: Not on file  . Alcohol Use: No   OB History    No data available     Review of Systems  Constitutional: Negative for fever and chills.  Respiratory: Negative for cough and shortness of breath.   Cardiovascular: Negative for chest pain.  Gastrointestinal: Negative for vomiting and abdominal pain.  All other systems reviewed and are negative.     Allergies  Review of patient's allergies indicates no known allergies.  Home Medications   Prior to Admission medications   Medication Sig Start Date End Date Taking? Authorizing Provider  aspirin-acetaminophen-caffeine (EXCEDRIN MIGRAINE) [phone] MG per tablet Take 2 tablets by mouth every 6 (six) hours as needed for headache (headache).    Yes Historical Provider, MD  ibuprofen (ADVIL,MOTRIN) 800 MG tablet Take 1 tablet (800 mg total) by mouth every 8 (eight) hours as needed. 10/23/13  Yes Christopher W Lawyer, PA-C   BP 129/71 mmHg   Pulse 79  Temp(Src) 97.3 F (36.3 C) (Oral)  Resp 18  SpO2 100%  LMP 11/02/2013 Physical Exam  Constitutional: She is oriented to person, place, and time. She appears well-developed and well-nourished. No distress.  HENT:  Head: Normocephalic and atraumatic.  Mouth/Throat: Oropharynx is clear and moist.  Eyes: EOM are normal. Pupils are equal, round, and reactive to light.  Neck: Normal range of motion. Neck supple.  Cardiovascular: Normal rate and regular rhythm.  Exam reveals no friction rub.   No murmur heard. Pulmonary/Chest: Effort normal and breath sounds normal. No respiratory distress. She has no wheezes. She has no rales.  Abdominal: Soft. She exhibits no distension. There is no tenderness. There is no rebound.  Musculoskeletal: Normal range of motion. She exhibits no edema.  Neurological: She is alert and oriented to person, place, and time. No cranial nerve deficit. She exhibits normal muscle tone. Coordination normal.  Skin: No rash noted. She is not diaphoretic.  Nursing note and vitals reviewed.   ED Course  Procedures (including critical care time) Labs Review Labs Reviewed - No data to display  Imaging Review No results found.   EKG Interpretation None      MDM   Final diagnoses:  Migraine without aura and without status migrainosus, not intractable    28 rolled female with history of migraines here with migraine today. Began this morning. Associated nausea and vomiting. No fevers, no numbness or tingling. Does have blurry vision history with migraines as blurry vision today. Here neuro exam nonfocal,  vitals stable, afebrile. Will give migraine cocktail and reassess. Feeling much better after migraine meds. Stable for discharge.  Elwin MochaBlair Lissett Favorite, MD 12/03/13 2329

## 2014-08-28 ENCOUNTER — Encounter (HOSPITAL_COMMUNITY): Payer: Self-pay | Admitting: Emergency Medicine

## 2014-08-28 ENCOUNTER — Emergency Department (HOSPITAL_COMMUNITY)
Admission: EM | Admit: 2014-08-28 | Discharge: 2014-08-29 | Disposition: A | Discharge status: Home / Self Care | Payer: Self-pay | Attending: Emergency Medicine | Admitting: Emergency Medicine

## 2014-08-28 DIAGNOSIS — Z8742 Personal history of other diseases of the female genital tract: Secondary | ICD-10-CM | POA: Insufficient documentation

## 2014-08-28 DIAGNOSIS — J029 Acute pharyngitis, unspecified: Secondary | ICD-10-CM | POA: Insufficient documentation

## 2014-08-28 DIAGNOSIS — N390 Urinary tract infection, site not specified: Secondary | ICD-10-CM | POA: Insufficient documentation

## 2014-08-28 DIAGNOSIS — Z720 Tobacco use: Secondary | ICD-10-CM | POA: Insufficient documentation

## 2014-08-28 DIAGNOSIS — G43909 Migraine, unspecified, not intractable, without status migrainosus: Secondary | ICD-10-CM | POA: Insufficient documentation

## 2014-08-28 LAB — RAPID STREP SCREEN (MED CTR MEBANE ONLY): Streptococcus, Group A Screen (Direct): NEGATIVE

## 2014-08-28 NOTE — ED Notes (Addendum)
Pt c/o sore throat with difficulty swallowing and urinary frequency. Pt denies fever, painful urination, odor/color change or abnormal discharge. Pt requesting STD check.

## 2014-08-29 LAB — I-STAT BETA HCG BLOOD, ED (MC, WL, AP ONLY): I-stat hCG, quantitative: <5 m[IU]/mL (ref <5)

## 2014-08-29 LAB — URINALYSIS, ROUTINE W REFLEX MICROSCOPIC
BILIRUBIN URINE: NEGATIVE
Glucose, UA: NEGATIVE mg/dL
Hgb urine dipstick: NEGATIVE
KETONES UR: NEGATIVE mg/dL
Nitrite: NEGATIVE
Protein, ur: NEGATIVE mg/dL
Specific Gravity, Urine: 1.023 (ref 1.005–1.030)
UROBILINOGEN UA: 0.2 mg/dL (ref 0.0–1.0)
pH: 7.5 (ref 5.0–8.0)

## 2014-08-29 LAB — URINE MICROSCOPIC-ADD ON

## 2014-08-29 MED ORDER — CEPHALEXIN 500 MG PO CAPS: 500.0000 mg | ORAL_CAPSULE | Freq: Four times a day (QID) | ORAL | Status: DC

## 2014-08-29 MED ORDER — CETIRIZINE HCL 10 MG PO CAPS: 10.0000 mg | ORAL_CAPSULE | Freq: Every day | ORAL | Status: DC

## 2014-08-29 NOTE — ED Notes (Signed)
Pt requesting pelvic exam due to unprotected sex. Pt also c/o sore throat. Redness and swelling noted to left side of throat. She denies vaginal discharge or bleeding.

## 2014-08-29 NOTE — Discharge Instructions (Signed)

## 2014-08-29 NOTE — ED Notes (Signed)
Pt alert, oriented, ambulatory upon DC. [facility] was [facility] She was also advised to take medications as prescribed.

## 2014-08-29 NOTE — ED Provider Notes (Signed)
CSN: [id number]     Arrival date & time 08/28/14  2249 History   First MD Initiated Contact with Patient 08/29/14 0015     Chief Complaint  Patient presents with  . Sore Throat  . Urinary Frequency     (Consider location/radiation/quality/duration/timing/severity/associated sxs/prior Treatment) HPI Comments: Patient presents with multiple complaints including dysuria and sore throat. She reports sore throat on and off for the past 2 weeks. She has some congestion, sneezing. She recently moved to the area and noticed sore throat is worse. No fever at any time. No difficulty swallowing.   She also complains of burning with urination. No vaginal discharge. She has some suprapubic discomfort. No abdominal pain, nausea, or vomiting. No flank pain or fever. Symptoms of dysuria started 2-3 days ago.  Patient is a 29 y.o. female presenting with pharyngitis and frequency. The history is provided by the patient. No language interpreter was used.  Sore Throat Associated symptoms include congestion and a sore throat. Pertinent negatives include no abdominal pain, coughing, fever, nausea or vomiting.  Urinary Frequency Associated symptoms include congestion and a sore throat. Pertinent negatives include no abdominal pain, coughing, fever, nausea or vomiting.    Past Medical History  Diagnosis Date  . Migraines   . Ovarian cyst    History reviewed. No pertinent past surgical history. Family History  Problem Relation Age of Onset  . Diabetes Other    History  Substance Use Topics  . Smoking status: Current Every Day Smoker -- 1.00 packs/day    Types: Cigarettes  . Smokeless tobacco: Not on file  . Alcohol Use: No   OB History    No data available     Review of Systems  Constitutional: Negative for fever.  HENT: Positive for congestion, sinus pressure, sneezing and sore throat.   Respiratory: Negative for cough.   Gastrointestinal: Negative for nausea, vomiting and abdominal pain.   Genitourinary: Positive for dysuria and frequency. Negative for vaginal bleeding and vaginal discharge.      Allergies  Review of patient's allergies indicates no known allergies.  Home Medications   Prior to Admission medications   Medication Sig Start Date End Date Taking? Authorizing Provider  aspirin-acetaminophen-caffeine (EXCEDRIN MIGRAINE) [phone] MG per tablet Take 2 tablets by mouth every 6 (six) hours as needed for headache (headache).    Yes Historical Provider, MD  ibuprofen (ADVIL,MOTRIN) 800 MG tablet Take 1 tablet (800 mg total) by mouth every 8 (eight) hours as needed. Patient not taking: Reported on 08/29/2014 10/23/13   Charlestine Night, PA-C   BP 140/80 mmHg  Pulse 99  SpO2 98%  LMP 07/31/2014 Physical Exam  Constitutional: She is oriented to person, place, and time. She appears well-developed and well-nourished.  HENT:  Nose: No mucosal edema.  Mouth/Throat: Oropharynx is clear and moist.  Neck: Normal range of motion.  Pulmonary/Chest: Effort normal.  Abdominal: Soft. She exhibits no distension. There is no tenderness.  Neurological: She is alert and oriented to person, place, and time.  Skin: Skin is warm and dry.    ED Course  Procedures (including critical care time) Labs Review Labs Reviewed  URINALYSIS, ROUTINE W REFLEX MICROSCOPIC (NOT AT [facility]) - Abnormal; Notable for the following:    APPearance TURBID (*)    Leukocytes, UA SMALL (*)    All other components within normal limits  URINE MICROSCOPIC-ADD ON - Abnormal; Notable for the following:    Squamous Epithelial / LPF FEW (*)    All other components within  normal limits  RAPID STREP SCREEN (NOT AT [facility])  CULTURE, GROUP A STREP  I-STAT BETA HCG BLOOD, ED ([facility], [facility], [facility] ONLY)    Imaging Review No results found.   EKG Interpretation None      MDM   Final diagnoses:  None    1. UTI 2. Pharyngitis  Symptoms of UTI with diagnosis supported by UA. No vaginal symptoms, pelvic  pain, fever or abdominal pain. Will treat with Keflex for 5 days.  Sore throat likely secondary to allergy type symptoms. Will recommend supportive care.     Elpidio Anis, PA-C 08/29/14 1610  Tomasita Crumble, MD 08/29/14 [phone]

## 2014-08-31 LAB — CULTURE, GROUP A STREP: STREP A CULTURE: NEGATIVE

## 2014-09-01 ENCOUNTER — Emergency Department (HOSPITAL_COMMUNITY)
Admission: EM | Admit: 2014-09-01 | Discharge: 2014-09-02 | Disposition: A | Discharge status: Home / Self Care | Payer: Self-pay | Attending: Emergency Medicine | Admitting: Emergency Medicine

## 2014-09-01 ENCOUNTER — Encounter (HOSPITAL_COMMUNITY): Payer: Self-pay | Admitting: Emergency Medicine

## 2014-09-01 DIAGNOSIS — J029 Acute pharyngitis, unspecified: Secondary | ICD-10-CM | POA: Insufficient documentation

## 2014-09-01 DIAGNOSIS — Z8742 Personal history of other diseases of the female genital tract: Secondary | ICD-10-CM | POA: Insufficient documentation

## 2014-09-01 DIAGNOSIS — Z792 Long term (current) use of antibiotics: Secondary | ICD-10-CM | POA: Insufficient documentation

## 2014-09-01 DIAGNOSIS — R519 Headache, unspecified: Secondary | ICD-10-CM

## 2014-09-01 DIAGNOSIS — Z79899 Other long term (current) drug therapy: Secondary | ICD-10-CM | POA: Insufficient documentation

## 2014-09-01 DIAGNOSIS — R51 Headache: Secondary | ICD-10-CM

## 2014-09-01 DIAGNOSIS — G43909 Migraine, unspecified, not intractable, without status migrainosus: Secondary | ICD-10-CM | POA: Insufficient documentation

## 2014-09-01 DIAGNOSIS — Z720 Tobacco use: Secondary | ICD-10-CM | POA: Insufficient documentation

## 2014-09-01 DIAGNOSIS — Z3202 Encounter for pregnancy test, result negative: Secondary | ICD-10-CM | POA: Insufficient documentation

## 2014-09-01 LAB — I-STAT BETA HCG BLOOD, ED (MC, WL, AP ONLY)

## 2014-09-01 MED ORDER — SODIUM CHLORIDE 0.9 % IV BOLUS (SEPSIS)
1000.0000 mL | Freq: Once | INTRAVENOUS | Status: AC
  Administered 2014-09-01: 1000 mL via INTRAVENOUS

## 2014-09-01 MED ORDER — DIPHENHYDRAMINE HCL 50 MG/ML IJ SOLN
25.0000 mg | Freq: Once | INTRAMUSCULAR | Status: AC
  Administered 2014-09-01: 25 mg via INTRAVENOUS
  Filled 2014-09-01: qty 1

## 2014-09-01 MED ORDER — KETOROLAC TROMETHAMINE 30 MG/ML IJ SOLN
30.0000 mg | Freq: Once | INTRAMUSCULAR | Status: AC
  Administered 2014-09-01: 30 mg via INTRAVENOUS
  Filled 2014-09-01: qty 1

## 2014-09-01 MED ORDER — METOCLOPRAMIDE HCL 5 MG/ML IJ SOLN
10.0000 mg | Freq: Once | INTRAMUSCULAR | Status: AC
  Administered 2014-09-01: 10 mg via INTRAVENOUS
  Filled 2014-09-01: qty 2

## 2014-09-01 NOTE — ED Notes (Signed)
Pt complaint of headache onset this morning; associated symptoms emesis and light sensitivity; hx of same.

## 2014-09-01 NOTE — ED Provider Notes (Signed)
CSN: [id number]     Arrival date & time 09/01/14  1852 History   First MD Initiated Contact with Patient 09/01/14 2036     Chief Complaint  Patient presents with  . Headache   Haniyah Maciolek is a 29 y.o. female with a history of migraines who presents to the ED complaining of a migraine headache since this morning. The patient complains and 8 out of 10 generalized headache that she reports is typical for her migraines. She reports associated photophobia and blurry vision which is also typical for her migraines. She denies double vision. She was having some nausea with vomiting earlier and has vomited twice today. She denies current nausea. Patient has taken nothing for treatment today. Patient also reports associated sore throat but reports she's been eating and drinking normally. She reports only having pain with swallowing. The patient denies fevers, chills, abdominal pain, urinary symptoms, numbness, tingling, weakness, rashes, chest pain, shortness of breath, cough, wheezing or double vision.  (Consider location/radiation/quality/duration/timing/severity/associated sxs/prior Treatment) HPI  Past Medical History  Diagnosis Date  . Migraines   . Ovarian cyst    History reviewed. No pertinent past surgical history. Family History  Problem Relation Age of Onset  . Diabetes Other    History  Substance Use Topics  . Smoking status: Current Every Day Smoker -- 1.00 packs/day    Types: Cigarettes  . Smokeless tobacco: Not on file  . Alcohol Use: No   OB History    No data available     Review of Systems  Constitutional: Negative for fever and chills.  HENT: Positive for sore throat. Negative for congestion, ear pain, rhinorrhea and trouble swallowing.   Eyes: Positive for photophobia. Negative for visual disturbance.  Respiratory: Negative for cough and shortness of breath.   Cardiovascular: Negative for chest pain and palpitations.  Gastrointestinal: Positive for nausea and  vomiting. Negative for abdominal pain.  Genitourinary: Negative for dysuria, urgency, frequency, hematuria and difficulty urinating.  Musculoskeletal: Negative for back pain, gait problem and neck pain.  Skin: Negative for rash.  Neurological: Positive for headaches. Negative for dizziness, syncope, speech difficulty, weakness, light-headedness and numbness.      Allergies  Review of patient's allergies indicates no known allergies.  Home Medications   Prior to Admission medications   Medication Sig Start Date End Date Taking? Authorizing Provider  aspirin-acetaminophen-caffeine (EXCEDRIN MIGRAINE) [phone] MG per tablet Take 2 tablets by mouth every 6 (six) hours as needed for headache (headache).    Yes Historical Provider, MD  cephALEXin (KEFLEX) 500 MG capsule Take 1 capsule (500 mg total) by mouth 4 (four) times daily. 08/29/14   Elpidio Anis, PA-C  Cetirizine HCl (ZYRTEC ALLERGY) 10 MG CAPS Take 1 capsule (10 mg total) by mouth daily. 08/29/14   Elpidio Anis, PA-C  ibuprofen (ADVIL,MOTRIN) 800 MG tablet Take 1 tablet (800 mg total) by mouth every 8 (eight) hours as needed. Patient not taking: Reported on 08/29/2014 10/23/13   Charlestine Night, PA-C   BP 133/66 mmHg  Pulse 87  Temp(Src) 98.1 F (36.7 C) (Oral)  Resp 18  Wt 220 lb (99.791 kg)  SpO2 97%  LMP 07/31/2014 Physical Exam  Constitutional: She is oriented to person, place, and time. She appears well-developed and well-nourished. No distress.  Nontoxic appearing.  HENT:  Head: Normocephalic and atraumatic.  Mouth/Throat: Oropharynx is clear and moist. No oropharyngeal exudate.  Left-sided tonsillar hypertrophy without exudates. Tongue protrusion is normal. No drooling. Uvula is midline without edema.   Eyes:  Conjunctivae are normal. Pupils are equal, round, and reactive to light. Right eye exhibits no discharge. Left eye exhibits no discharge.  Neck: Normal range of motion. Neck supple. No JVD present. No tracheal  deviation present.  Normal range of motion of the neck. No cervical lymphadenopathy.  Cardiovascular: Normal rate, regular rhythm, normal heart sounds and intact distal pulses.  Exam reveals no gallop and no friction rub.   No murmur heard. Pulmonary/Chest: Effort normal and breath sounds normal. No respiratory distress. She has no wheezes. She has no rales.  Lungs are clear to auscultation bilaterally.  Abdominal: Soft. She exhibits no distension. There is no tenderness. There is no guarding.  Abdomen is soft and nontender to palpation.  Musculoskeletal: She exhibits no edema or tenderness.  Patient is spontaneously moving all extremities in a coordinated fashion exhibiting good strength.   Lymphadenopathy:    She has no cervical adenopathy.  Neurological: She is alert and oriented to person, place, and time. No cranial nerve deficit. Coordination normal.  She is alert and oriented 3. Cranial nerves are intact. No pronator drift. Finger to nose intact bilaterally. EOMs intact bilaterally. Sensation intact bilateral upper and lower extremities.  Skin: Skin is warm and dry. No rash noted. She is not diaphoretic. No erythema. No pallor.  Psychiatric: She has a normal mood and affect. Her behavior is normal.  Nursing note and vitals reviewed.   ED Course  Procedures (including critical care time) Labs Review Labs Reviewed  I-STAT BETA HCG BLOOD, ED ([facility], [facility], [facility] ONLY)    Imaging Review No results found.   EKG Interpretation None      Filed Vitals:   09/01/14 1857 09/01/14 2340  BP: 136/80 133/66  Pulse: 99 87  Temp: 98.1 F (36.7 C)   TempSrc: Oral   Resp: 18 18  Weight: 220 lb (99.791 kg)   SpO2: 98% 97%     MDM   Meds given in ED:  Medications  sodium chloride 0.9 % bolus 1,000 mL (0 mLs Intravenous Stopped 09/01/14 2234)  metoCLOPramide (REGLAN) injection 10 mg (10 mg Intravenous Given 09/01/14 2228)  diphenhydrAMINE (BENADRYL) injection 25 mg (25 mg Intravenous Given  09/01/14 2228)  ketorolac (TORADOL) 30 MG/ML injection 30 mg (30 mg Intravenous Given 09/01/14 2338)    New Prescriptions   No medications on file    Final diagnoses:  Bad headache   Pt HA treated and improved while in ED. After fluid bolus, Reglan, Benadryl and Toradol the patient's headache resolved and she reports no pain.  Presentation is like pts typical HA and non concerning for [facility], ICH, Meningitis, or temporal arteritis. Pt is afebrile with no focal neuro deficits, nuchal rigidity, or change in vision. She reports feeling ready for discharge. Pt is to follow up with PCP to discuss prophylactic medication. I advised the patient to follow-up with their primary care provider this week. I advised the patient to return to the emergency department with new or worsening symptoms or new concerns. The patient verbalized understanding and agreement with plan.      Everlene Farrier, PA-C 09/01/14 2358  Alvira Monday, MD 09/02/14 1330

## 2014-09-01 NOTE — Discharge Instructions (Signed)

## 2014-11-08 ENCOUNTER — Emergency Department (HOSPITAL_COMMUNITY)
Admission: EM | Admit: 2014-11-08 | Discharge: 2014-11-08 | Disposition: A | Discharge status: Home / Self Care | Payer: Self-pay | Attending: Emergency Medicine | Admitting: Emergency Medicine

## 2014-11-08 ENCOUNTER — Encounter (HOSPITAL_COMMUNITY): Payer: Self-pay | Admitting: Emergency Medicine

## 2014-11-08 DIAGNOSIS — Z79899 Other long term (current) drug therapy: Secondary | ICD-10-CM | POA: Insufficient documentation

## 2014-11-08 DIAGNOSIS — N390 Urinary tract infection, site not specified: Secondary | ICD-10-CM | POA: Insufficient documentation

## 2014-11-08 DIAGNOSIS — G43909 Migraine, unspecified, not intractable, without status migrainosus: Secondary | ICD-10-CM | POA: Insufficient documentation

## 2014-11-08 DIAGNOSIS — Z720 Tobacco use: Secondary | ICD-10-CM | POA: Insufficient documentation

## 2014-11-08 LAB — URINALYSIS, ROUTINE W REFLEX MICROSCOPIC
BILIRUBIN URINE: NEGATIVE
Glucose, UA: NEGATIVE mg/dL
Hgb urine dipstick: NEGATIVE
KETONES UR: NEGATIVE mg/dL
LEUKOCYTES UA: NEGATIVE
NITRITE: POSITIVE — AB
PROTEIN: NEGATIVE mg/dL
Specific Gravity, Urine: 1.022 (ref 1.005–1.030)
UROBILINOGEN UA: 0.2 mg/dL (ref 0.0–1.0)
pH: 6 (ref 5.0–8.0)

## 2014-11-08 LAB — WET PREP, GENITAL
CLUE CELLS WET PREP: NONE SEEN
TRICH WET PREP: NONE SEEN
WBC, Wet Prep HPF POC: NONE SEEN
YEAST WET PREP: NONE SEEN

## 2014-11-08 LAB — URINE MICROSCOPIC-ADD ON

## 2014-11-08 MED ORDER — FOSFOMYCIN TROMETHAMINE 3 G PO PACK
3.0000 g | PACK | Freq: Once | ORAL | Status: AC
  Administered 2014-11-08: 3 g via ORAL
  Filled 2014-11-08: qty 3

## 2014-11-08 NOTE — ED Provider Notes (Signed)
CSN: [id number]     Arrival date & time 11/08/14  1345 History   First MD Initiated Contact with Patient 11/08/14 1405     Chief Complaint  Patient presents with  . Dysuria  . Vaginal Discharge     (Consider location/radiation/quality/duration/timing/severity/associated sxs/prior Treatment) Patient is a 29 y.o. female presenting with dysuria and vaginal discharge.  Dysuria Pain quality:  Burning Pain severity:  Mild Onset quality:  Sudden Duration:  2 days Timing:  Constant Progression:  Worsening Chronicity:  Recurrent Recent urinary tract infections: no   Relieved by:  Nothing Worsened by:  Nothing tried Associated symptoms: vaginal discharge   Associated symptoms: no fever, no nausea and no vomiting   Risk factors: no hx of pyelonephritis and no renal disease   Vaginal Discharge Associated symptoms: dysuria   Associated symptoms: no fever, no nausea and no vomiting    29 yo F with a chief complaint of vaginal discharge. This been going on for couple days. Patient has had some mild cramping with this though just got off her menstrual cycle. Patient has some dysuria just before she urinates. Denies increased frequency or hesitancy. Denies fevers or chills. Denies flank pain. Denies risky sexual behavior.  Past Medical History  Diagnosis Date  . Migraines   . Ovarian cyst    History reviewed. No pertinent past surgical history. Family History  Problem Relation Age of Onset  . Diabetes Other    Social History  Substance Use Topics  . Smoking status: Current Every Day Smoker -- 1.00 packs/day    Types: Cigarettes  . Smokeless tobacco: None  . Alcohol Use: No   OB History    No data available     Review of Systems  Constitutional: Negative for fever and chills.  HENT: Negative for congestion and rhinorrhea.   Eyes: Negative for redness and visual disturbance.  Respiratory: Negative for shortness of breath and wheezing.   Cardiovascular: Negative for chest pain  and palpitations.  Gastrointestinal: Negative for nausea and vomiting.  Genitourinary: Positive for dysuria and vaginal discharge. Negative for urgency.  Musculoskeletal: Negative for myalgias and arthralgias.  Skin: Negative for pallor and wound.  Neurological: Negative for dizziness and headaches.      Allergies  Review of patient's allergies indicates no known allergies.  Home Medications   Prior to Admission medications   Medication Sig Start Date End Date Taking? Authorizing Provider  aspirin-acetaminophen-caffeine (EXCEDRIN MIGRAINE) [phone] MG per tablet Take 2 tablets by mouth every 6 (six) hours as needed for headache (headache).    Yes Historical Provider, MD  cetirizine (ZYRTEC) 10 MG tablet Take 10 mg by mouth daily.   Yes Historical Provider, MD  cephALEXin (KEFLEX) 500 MG capsule Take 1 capsule (500 mg total) by mouth 4 (four) times daily. Patient not taking: Reported on 11/08/2014 08/29/14   Elpidio Anis, PA-C  Cetirizine HCl (ZYRTEC ALLERGY) 10 MG CAPS Take 1 capsule (10 mg total) by mouth daily. Patient not taking: Reported on 11/08/2014 08/29/14   Elpidio Anis, PA-C   BP 124/56 mmHg  Pulse 73  Temp(Src) 98.4 F (36.9 C) (Oral)  Resp 16  SpO2 94%  LMP 11/01/2014 Physical Exam  Constitutional: She is oriented to person, place, and time. She appears well-developed and well-nourished. No distress.  HENT:  Head: Normocephalic and atraumatic.  Eyes: EOM are normal. Pupils are equal, round, and reactive to light.  Neck: Normal range of motion. Neck supple.  Cardiovascular: Normal rate and regular rhythm.  Exam reveals  no gallop and no friction rub.   No murmur heard. Pulmonary/Chest: Effort normal. She has no wheezes. She has no rales.  Abdominal: Soft. She exhibits no distension. There is no tenderness.  Genitourinary: Uterus is not enlarged. Cervix exhibits no motion tenderness, no discharge and no friability. Right adnexum displays no mass and no tenderness.  Left adnexum displays no mass and no tenderness.  Musculoskeletal: She exhibits no edema or tenderness.  Neurological: She is alert and oriented to person, place, and time.  Skin: Skin is warm and dry. She is not diaphoretic.  Psychiatric: She has a normal mood and affect. Her behavior is normal.    ED Course  Procedures (including critical care time) Labs Review Labs Reviewed  URINALYSIS, ROUTINE W REFLEX MICROSCOPIC (NOT AT [facility]) - Abnormal; Notable for the following:    Nitrite POSITIVE (*)    All other components within normal limits  URINE MICROSCOPIC-ADD ON - Abnormal; Notable for the following:    Bacteria, UA MANY (*)    All other components within normal limits  WET PREP, GENITAL  HIV ANTIBODY (ROUTINE TESTING)  GC/CHLAMYDIA PROBE AMP ([place]) NOT AT [facility]    Imaging Review No results found. I have personally reviewed and evaluated these images and lab results as part of my medical decision-making.   EKG Interpretation None      MDM   Final diagnoses:  UTI (lower urinary tract infection)    29 yo F with a chief complaint of vaginal discharge. No noted focal tenderness on pelvic exam. UA with nitrate positive and many bacteria with no white cells. Will treat with fosfomycin. Awaiting wet prep.  Wet prep unremarkable, gyn, pcp follow up.   I have discussed the diagnosis/risks/treatment options with the patient and believe the pt to be eligible for discharge home to follow-up with PCP/gyn. We also discussed returning to the ED immediately if new or worsening sx occur. We discussed the sx which are most concerning (e.g., sudden worsening pain, fever, inability to tolerate by mouth) that necessitate immediate return. Medications administered to the patient during their visit and any new prescriptions provided to the patient are listed below.  Medications given during this visit Medications  fosfomycin (MONUROL) packet 3 g (3 g Oral Given 11/08/14 1555)     Discharge Medication List as of 11/08/2014  3:59 PM      The patient appears reasonably screen and/or stabilized for discharge and I doubt any other medical condition or other [facility] requiring further screening, evaluation, or treatment in the ED at this time prior to discharge.    Melene Plan, DO 11/09/14 1530

## 2014-11-08 NOTE — Discharge Instructions (Signed)

## 2014-11-08 NOTE — ED Notes (Signed)
Pt states that she has unprotected sex with her boyfriend and recently coming off her menstrual cycle and having discolored discharge.  Pt also states that she has dysuria.

## 2014-11-09 LAB — GC/CHLAMYDIA PROBE AMP (~~LOC~~) NOT AT ARMC
Chlamydia: NEGATIVE
Neisseria Gonorrhea: NEGATIVE

## 2014-11-09 LAB — HIV ANTIBODY (ROUTINE TESTING W REFLEX): HIV SCREEN 4TH GENERATION: NONREACTIVE

## 2015-09-10 ENCOUNTER — Emergency Department (HOSPITAL_COMMUNITY)
Admission: EM | Admit: 2015-09-10 | Discharge: 2015-09-10 | Disposition: A | Discharge status: Home / Self Care | Payer: Self-pay | Attending: Emergency Medicine | Admitting: Emergency Medicine

## 2015-09-10 ENCOUNTER — Encounter (HOSPITAL_COMMUNITY): Payer: Self-pay | Admitting: Emergency Medicine

## 2015-09-10 DIAGNOSIS — Z7982 Long term (current) use of aspirin: Secondary | ICD-10-CM | POA: Insufficient documentation

## 2015-09-10 DIAGNOSIS — F17210 Nicotine dependence, cigarettes, uncomplicated: Secondary | ICD-10-CM | POA: Insufficient documentation

## 2015-09-10 DIAGNOSIS — J039 Acute tonsillitis, unspecified: Secondary | ICD-10-CM | POA: Insufficient documentation

## 2015-09-10 DIAGNOSIS — G43009 Migraine without aura, not intractable, without status migrainosus: Secondary | ICD-10-CM | POA: Insufficient documentation

## 2015-09-10 LAB — CBC WITH DIFFERENTIAL/PLATELET
Basophils Absolute: 0 K/uL (ref 0.0–0.1)
Basophils Relative: 0 %
Eosinophils Absolute: 0 K/uL (ref 0.0–0.7)
Eosinophils Relative: 0 %
HCT: 42.1 % (ref 36.0–46.0)
Hemoglobin: 13.9 g/dL (ref 12.0–15.0)
Lymphocytes Relative: 19 %
Lymphs Abs: 2.8 K/uL (ref 0.7–4.0)
MCH: 29.4 pg (ref 26.0–34.0)
MCHC: 33 g/dL (ref 30.0–36.0)
MCV: 89.2 fL (ref 78.0–100.0)
Monocytes Absolute: 1.3 K/uL — ABNORMAL HIGH (ref 0.1–1.0)
Monocytes Relative: 9 %
Neutro Abs: 10.2 K/uL — ABNORMAL HIGH (ref 1.7–7.7)
Neutrophils Relative %: 72 %
Platelets: 240 K/uL (ref 150–400)
RBC: 4.72 MIL/uL (ref 3.87–5.11)
RDW: 12.7 % (ref 11.5–15.5)
WBC: 14.2 K/uL — ABNORMAL HIGH (ref 4.0–10.5)

## 2015-09-10 LAB — BASIC METABOLIC PANEL WITH GFR
Anion gap: 7 (ref 5–15)
BUN: 7 mg/dL (ref 6–20)
CO2: 23 mmol/L (ref 22–32)
Calcium: 8.3 mg/dL — ABNORMAL LOW (ref 8.9–10.3)
Chloride: 108 mmol/L (ref 101–111)
Creatinine, Ser: 0.64 mg/dL (ref 0.44–1.00)
GFR calc Af Amer: >60 mL/min
GFR calc non Af Amer: >60 mL/min
Glucose, Bld: 83 mg/dL (ref 65–99)
Potassium: 3.5 mmol/L (ref 3.5–5.1)
Sodium: 138 mmol/L (ref 135–145)

## 2015-09-10 LAB — I-STAT BETA HCG BLOOD, ED (MC, WL, AP ONLY): I-stat hCG, quantitative: <5 m[IU]/mL (ref <5)

## 2015-09-10 LAB — RAPID STREP SCREEN (MED CTR MEBANE ONLY): STREPTOCOCCUS, GROUP A SCREEN (DIRECT): NEGATIVE

## 2015-09-10 MED ORDER — HYDROCODONE-ACETAMINOPHEN 5-325 MG PO TABS: 1.0000 | ORAL_TABLET | Freq: Four times a day (QID) | ORAL | 0 refills | Status: DC | PRN

## 2015-09-10 MED ORDER — AMOXICILLIN-POT CLAVULANATE 875-125 MG PO TABS: 1.0000 | ORAL_TABLET | Freq: Two times a day (BID) | ORAL | 0 refills | Status: DC

## 2015-09-10 MED ORDER — CEFTRIAXONE SODIUM 1 G IJ SOLR
1.0000 g | Freq: Once | INTRAMUSCULAR | Status: AC
  Administered 2015-09-10: 1 g via INTRAMUSCULAR
  Filled 2015-09-10: qty 10

## 2015-09-10 MED ORDER — DEXAMETHASONE SODIUM PHOSPHATE 10 MG/ML IJ SOLN
10.0000 mg | Freq: Once | INTRAMUSCULAR | Status: AC
  Administered 2015-09-10: 10 mg via INTRAVENOUS
  Filled 2015-09-10: qty 1

## 2015-09-10 MED ORDER — LIDOCAINE HCL (PF) 1 % IJ SOLN
INTRAMUSCULAR | Status: AC
  Administered 2015-09-10: 2.1 mL
  Filled 2015-09-10: qty 5

## 2015-09-10 MED ORDER — METOCLOPRAMIDE HCL 5 MG/ML IJ SOLN
10.0000 mg | Freq: Once | INTRAMUSCULAR | Status: AC
  Administered 2015-09-10: 10 mg via INTRAVENOUS
  Filled 2015-09-10: qty 2

## 2015-09-10 MED ORDER — DIPHENHYDRAMINE HCL 50 MG/ML IJ SOLN
25.0000 mg | Freq: Once | INTRAMUSCULAR | Status: AC
  Administered 2015-09-10: 25 mg via INTRAVENOUS
  Filled 2015-09-10: qty 1

## 2015-09-10 NOTE — Discharge Instructions (Signed)
Do not take the narcotic pain medication if driving as it will make you sleepy. If your symptoms of sore throat worsen you will need to see the ENT doctor. Return here if you have difficulty swallowing or other problems.

## 2015-09-10 NOTE — ED Provider Notes (Signed)
[facility]-EMERGENCY DEPT Provider Note   CSN: [id number] Arrival date & time: 09/10/15  1445  First Provider Contact:   First MD Initiated Contact with Patient 09/10/15 1720       By signing my name below, I, Placido Sou, attest that this documentation has been prepared under the direction and in the presence of Kerrie Buffalo, NP. Electronically Signed: Placido Sou, ED Scribe. 09/10/15. 5:28 PM.    History   Chief Complaint Chief Complaint  Patient presents with  . Sore Throat    HPI HPI Comments: Diane Fletcher is a 30 y.o. female who presents to the Emergency Department complaining of worsening, moderate, sore throat x 3 days. She reports associated, intermittent, fevers. She has taken cough drops w/o relief. Pt denies chills.  Pt additionally reports a constant, moderate, 8/10, HA onset earlier today. She reports a hx of migraines with her last occurrence about 1 month ago. Pt denies ever having been formally dx with migraines or having had a CT scan performed in the past. She confirms her HA is consistent with prior exacerbations and notes she typically comes to the ED for a migraine cocktail which provides relief of her symptoms. Pt reports associated, intermittent, nausea. She denies vomiting or any other associated symptoms at this time.   PCP: None   The history is provided by the patient. No language interpreter was used.  Sore Throat  This is a new problem. The current episode started more than 2 days ago. The problem occurs constantly. The problem has been gradually worsening. Associated symptoms include headaches. The symptoms are aggravated by swallowing. Nothing relieves the symptoms. Treatments tried: cough drops. The treatment provided no relief.   Past Medical History:  Diagnosis Date  . Migraines   . Ovarian cyst     There are no active problems to display for this patient.   History reviewed. No pertinent surgical history.  OB History    No data available         Home Medications    Prior to Admission medications   Medication Sig Start Date End Date Taking? Authorizing Provider  amoxicillin-clavulanate (AUGMENTIN) 875-125 MG tablet Take 1 tablet by mouth 2 (two) times daily. 09/10/15   Hope Orlene Och, NP  aspirin-acetaminophen-caffeine (EXCEDRIN MIGRAINE) [phone] MG per tablet Take 2 tablets by mouth every 6 (six) hours as needed for headache (headache).     Historical Provider, MD  cephALEXin (KEFLEX) 500 MG capsule Take 1 capsule (500 mg total) by mouth 4 (four) times daily. Patient not taking: Reported on 11/08/2014 08/29/14   Elpidio Anis, PA-C  cetirizine (ZYRTEC) 10 MG tablet Take 10 mg by mouth daily.    Historical Provider, MD  Cetirizine HCl (ZYRTEC ALLERGY) 10 MG CAPS Take 1 capsule (10 mg total) by mouth daily. Patient not taking: Reported on 11/08/2014 08/29/14   Elpidio Anis, PA-C  HYDROcodone-acetaminophen (NORCO) 5-325 MG tablet Take 1 tablet by mouth every 6 (six) hours as needed. 09/10/15   Hope Orlene Och, NP    Family History Family History  Problem Relation Age of Onset  . Diabetes Other     Social History Social History  Substance Use Topics  . Smoking status: Current Every Day Smoker    Packs/day: 1.00    Types: Cigarettes  . Smokeless tobacco: Never Used  . Alcohol use No     Allergies   Review of patient's allergies indicates no known allergies.   Review of Systems Review of Systems  Constitutional: Positive for  fever. Negative for chills.  HENT: Positive for sore throat.   Gastrointestinal: Positive for nausea. Negative for vomiting.  Neurological: Positive for headaches.  All other systems reviewed and are negative.  Physical Exam Updated Vital Signs BP 124/74 (BP Location: Right Arm)   Pulse 107   Temp 98.4 F (36.9 C) (Oral)   Resp 18   Ht  (1.626 m)   Wt 90.7 kg   LMP 09/06/2015   SpO2 98%   BMI 34.33 kg/m   Physical Exam  Constitutional: She is oriented to person, place, and  time. Vital signs are normal. She appears well-developed and well-nourished.  Non-toxic appearance. No distress.  Afebrile, nontoxic, NAD  HENT:  Head: Normocephalic and atraumatic.  Mouth/Throat: Uvula is midline and mucous membranes are normal. Posterior oropharyngeal erythema present. Posterior oropharyngeal edema: mild. Tonsils are 0 on the right. Tonsils are 2+ on the left.  Inflamed and swollen left tonsil   Eyes: Conjunctivae and EOM are normal. Pupils are equal, round, and reactive to light. Right eye exhibits no discharge. Left eye exhibits no discharge.  PERRL, EOMI, no nystagmus, no visual field deficits   Neck: Normal range of motion. Neck supple. No spinous process tenderness and no muscular tenderness present. No neck rigidity. Normal range of motion present.  FROM intact without spinous process TTP, no bony stepoffs or deformities. No meningeal signs.  Cardiovascular: Regular rhythm and intact distal pulses.  Tachycardia present.   Pulmonary/Chest: Effort normal and breath sounds normal. No respiratory distress.  Abdominal: Soft. Normal appearance. She exhibits no distension.  Musculoskeletal: Normal range of motion. She exhibits no edema or deformity.  Strength and sensation grossly intact Distal pulses intact Gait steady  Lymphadenopathy:    She has cervical adenopathy (left anterior).  Neurological: She is alert and oriented to person, place, and time. She has normal strength and normal reflexes. No cranial nerve deficit or sensory deficit. She displays a negative Romberg sign. Coordination and gait normal. GCS eye subscore is 4. GCS verbal subscore is 5. GCS motor subscore is 6.  CN 2-12 grossly intact A&O x4 GCS 15 Sensation and strength intact Gait nonataxic  Neg pronator drift   Skin: Skin is warm, dry and intact. No rash noted.  Psychiatric: She has a normal mood and affect. Her behavior is normal.  Nursing note and vitals reviewed.  ED Treatments / Results   Labs (all labs ordered are listed, but only abnormal results are displayed) Labs Reviewed  CBC WITH DIFFERENTIAL/PLATELET - Abnormal; Notable for the following:       Result Value   WBC 14.2 (*)    Neutro Abs 10.2 (*)    Monocytes Absolute 1.3 (*)    All other components within normal limits  BASIC METABOLIC PANEL - Abnormal; Notable for the following:    Calcium 8.3 (*)    All other components within normal limits  RAPID STREP SCREEN (NOT AT [facility])  CULTURE, GROUP A STREP ([facility])  URINALYSIS, ROUTINE W REFLEX MICROSCOPIC (NOT AT [facility])  I-STAT BETA HCG BLOOD, ED ([facility], [facility], [facility] ONLY)     Radiology No results found.  Procedures Procedures  DIAGNOSTIC STUDIES: Oxygen Saturation is 98% on RA, normal by my interpretation.    COORDINATION OF CARE: 5:24 PM Discussed next steps with pt. Pt verbalized understanding and is agreeable with the plan.    Medications Ordered in ED Medications  dexamethasone (DECADRON) injection 10 mg (10 mg Intravenous Given 09/10/15 1802)  metoCLOPramide (REGLAN) injection 10 mg (  10 mg Intravenous Given 09/10/15 1802)  diphenhydrAMINE (BENADRYL) injection 25 mg (25 mg Intravenous Given 09/10/15 1802)  cefTRIAXone (ROCEPHIN) injection 1 g (1 g Intramuscular Given 09/10/15 1942)  lidocaine (PF) (XYLOCAINE) 1 % injection (2.1 mLs  Given 09/10/15 1943)     Initial Impression / Assessment and Plan / ED Course  I have reviewed the triage vital signs and the nursing notes.  Pertinent lab results that were available during my care of the patient were reviewed by me and considered in my medical decision making (see chart for details).  Clinical Course   I ordered a CT of the patient's neck to look for abscess of the left tonsil. After patient had migraine cocktail she reports her headache is gone and she can swallow and does not want a CT scan. I discussed in detail with the patient clinical findings and concerns. She declined CT. Dr. Clayborne DanaMesner made aware of patient's  clinical findings and and her decision. Since she is feeling better and wanting to eat and does not appear to have a problems swallowing will d/c home with detailed instructions regarding ENT f/u if symptoms persist or return here for difficulty swallowing or other problems.   I personally performed the services described in this documentation, which was scribed in my presence. The recorded information has been reviewed and is accurate.   Final Clinical Impressions(s) / ED Diagnoses  30 y.o. female with headache and sore throat stable for d/c after medications and improvement in her symptoms. Discussed possibility of tonsillar abscess and patient is aware that since she declined the CT scan she will need close follow up if symptoms persist.  Will start antibiotics and pain management.  Final diagnoses:  Tonsillitis  Migraine without aura and without status migrainosus, not intractable    New Prescriptions New Prescriptions   AMOXICILLIN-CLAVULANATE (AUGMENTIN) 875-125 MG TABLET    Take 1 tablet by mouth 2 (two) times daily.   HYDROCODONE-ACETAMINOPHEN (NORCO) 5-325 MG TABLET    Take 1 tablet by mouth every 6 (six) hours as needed.     Lake AnnetteHope M Neese, NP 09/10/15 1956    Marily MemosJason Mesner, MD 09/11/15 [phone]

## 2015-09-10 NOTE — ED Notes (Signed)
Pt resting in room with the lights low.

## 2015-09-10 NOTE — ED Notes (Signed)
Pt complaining of sharp pain in throat with swallowing. Pt states "it feels like strep throat." Pt also complaining of migraine since this am. Pt states feels like typical migraine.

## 2015-09-12 LAB — CULTURE, GROUP A STREP (THRC)

## 2016-03-23 ENCOUNTER — Emergency Department (HOSPITAL_COMMUNITY)
Admission: EM | Admit: 2016-03-23 | Discharge: 2016-03-23 | Disposition: A | Discharge status: Home / Self Care | Payer: BLUE CROSS/BLUE SHIELD | Attending: Emergency Medicine | Admitting: Emergency Medicine

## 2016-03-23 ENCOUNTER — Encounter (HOSPITAL_COMMUNITY): Payer: Self-pay

## 2016-03-23 DIAGNOSIS — Z79899 Other long term (current) drug therapy: Secondary | ICD-10-CM | POA: Insufficient documentation

## 2016-03-23 DIAGNOSIS — M5416 Radiculopathy, lumbar region: Secondary | ICD-10-CM | POA: Diagnosis not present

## 2016-03-23 DIAGNOSIS — F17210 Nicotine dependence, cigarettes, uncomplicated: Secondary | ICD-10-CM | POA: Insufficient documentation

## 2016-03-23 DIAGNOSIS — M545 Low back pain: Secondary | ICD-10-CM | POA: Diagnosis present

## 2016-03-23 MED ORDER — METHOCARBAMOL 500 MG PO TABS: ORAL_TABLET | ORAL | 0 refills | Status: DC

## 2016-03-23 MED ORDER — KETOROLAC TROMETHAMINE 60 MG/2ML IM SOLN
30.0000 mg | Freq: Once | INTRAMUSCULAR | Status: AC
  Administered 2016-03-23: 30 mg via INTRAMUSCULAR
  Filled 2016-03-23: qty 2

## 2016-03-23 MED ORDER — PREDNISONE 50 MG PO TABS: ORAL_TABLET | ORAL | 0 refills | Status: DC

## 2016-03-23 NOTE — ED Triage Notes (Signed)
For 2 days c/o lower back pain all the way across and radiates down left legs worse with movement no dysuria voiced.

## 2016-03-23 NOTE — ED Provider Notes (Signed)
[facility]-EMERGENCY DEPT Provider Note   CSN: [id number] Arrival date & time: 03/23/16  2118 By signing my name below, I, Levon Hedger, attest that this documentation has been prepared under the direction and in the presence of non-physician practitioner, Wynetta Emery, PA-C. Electronically Signed: Levon Hedger, Scribe. 03/23/2016. 10:04 PM.   History   Chief Complaint Chief Complaint  Patient presents with  . Back Pain    radiates down left leg.    HPI Diane Fletcher is a 31 y.o. female who presents to the Emergency Department complaining of constant, lower back pain with radiation to her bilateral lower legs onset two days ago. Pt describes this as 10/10, shooting, throbbing pain that is exacerbated by movement. No alleviating factors noted. No prior treatments indicated. She denies any history of cancer or IV drug use. Pt is not currently followed by a PCP. Pt has no other complaints or symptoms at this time.   The history is provided by the patient. No language interpreter was used.    Past Medical History:  Diagnosis Date  . Migraines   . Ovarian cyst     There are no active problems to display for this patient.   History reviewed. No pertinent surgical history.  OB History    No data available       Home Medications    Prior to Admission medications   Medication Sig Start Date End Date Taking? Authorizing Provider  amoxicillin-clavulanate (AUGMENTIN) 875-125 MG tablet Take 1 tablet by mouth 2 (two) times daily. 09/10/15   Hope Orlene Och, NP  aspirin-acetaminophen-caffeine (EXCEDRIN MIGRAINE) [phone] MG per tablet Take 2 tablets by mouth every 6 (six) hours as needed for headache (headache).     Historical Provider, MD  cephALEXin (KEFLEX) 500 MG capsule Take 1 capsule (500 mg total) by mouth 4 (four) times daily. Patient not taking: Reported on 11/08/2014 08/29/14   Elpidio Anis, PA-C  cetirizine (ZYRTEC) 10 MG tablet Take 10 mg by mouth daily.    Historical  Provider, MD  Cetirizine HCl (ZYRTEC ALLERGY) 10 MG CAPS Take 1 capsule (10 mg total) by mouth daily. Patient not taking: Reported on 11/08/2014 08/29/14   Elpidio Anis, PA-C  HYDROcodone-acetaminophen (NORCO) 5-325 MG tablet Take 1 tablet by mouth every 6 (six) hours as needed. 09/10/15   Hope Orlene Och, NP  methocarbamol (ROBAXIN) 500 MG tablet Can take up to 1-2 tabs every 6 hours PRN PAIN 03/23/16   Joni Reining Kalynne Womac, PA-C  predniSONE (DELTASONE) 50 MG tablet Take 1 tablet daily with breakfast 03/23/16   Wynetta Emery, PA-C    Family History Family History  Problem Relation Age of Onset  . Diabetes Other     Social History Social History  Substance Use Topics  . Smoking status: Current Every Day Smoker    Packs/day: 1.00    Types: Cigarettes  . Smokeless tobacco: Never Used  . Alcohol use No     Allergies   Patient has no known allergies.   Review of Systems Review of Systems 10 systems reviewed and all are negative for acute change except as noted in the HPI.  Physical Exam Updated Vital Signs BP 136/89 (BP Location: Left Arm)   Pulse 88   Temp 98.2 F (36.8 C) (Oral)   Resp 18   Ht 5\' 2"  (1.575 m)   Wt 98.9 kg   SpO2 98%   BMI 39.87 kg/m   Physical Exam  Constitutional: She is oriented to person, place, and time. She appears well-developed  and well-nourished. No distress.  HENT:  Head: Normocephalic and atraumatic.  Mouth/Throat: Oropharynx is clear and moist.  Eyes: Conjunctivae and EOM are normal. Pupils are equal, round, and reactive to light.  Neck: Normal range of motion.  Cardiovascular: Normal rate, regular rhythm and intact distal pulses.   Pulmonary/Chest: Effort normal and breath sounds normal. No respiratory distress. She has no wheezes. She has no rales. She exhibits no tenderness.  Abdominal: Soft. She exhibits no distension and no mass. There is no tenderness. There is no rebound and no guarding. No hernia.  Musculoskeletal: Normal range of  motion.  Neurological: She is alert and oriented to person, place, and time.  No point tenderness to percussion of lumbar spinal processes.  No TTP or paraspinal muscular spasm. Strength is 5 out of 5 to bilateral lower extremities at hip and knee; extensor hallucis longus 5 out of 5. Ankle strength 5 out of 5, no clonus, neurovascularly intact. No saddle anaesthesia. Patellar reflexes are 2+ bilaterally.    Straight leg raise is positive on the left at 35.   Skin: Skin is warm and dry. Capillary refill takes less than 2 seconds. She is not diaphoretic.  Psychiatric: She has a normal mood and affect.  Nursing note and vitals reviewed.  ED Treatments / Results  DIAGNOSTIC STUDIES:  Oxygen Saturation is 98% on RA, normal by my interpretation.    COORDINATION OF CARE:  10:00 PM Discussed treatment plan with pt at bedside and pt agreed to plan.   Labs (all labs ordered are listed, but only abnormal results are displayed) Labs Reviewed - No data to display  EKG  EKG Interpretation None       Radiology No results found.  Procedures Procedures (including critical care time)  Medications Ordered in ED Medications  ketorolac (TORADOL) injection 30 mg (30 mg Intramuscular Given 03/23/16 2210)     Initial Impression / Assessment and Plan / ED Course  I have reviewed the triage vital signs and the nursing notes.  Pertinent labs & imaging results that were available during my care of the patient were reviewed by me and considered in my medical decision making (see chart for details).    Vitals:   03/23/16 2125 03/23/16 2129  BP: 136/89   Pulse: 88   Resp: 18   Temp: 98.2 F (36.8 C)   TempSrc: Oral   SpO2: 98%   Weight:  98.9 kg  Height:  5\' 2"  (1.575 m)    Medications  ketorolac (TORADOL) injection 30 mg (30 mg Intramuscular Given 03/23/16 2210)    [name] is 31 y.o. female presenting with Low back pain with shooting pains down the left leg by certain  positions. Neurovascularly intact.  back pain.  No neurological deficits and normal neuro exam.  Patient can walk but states is painful.  No loss of bowel or bladder control.  No concern for cauda equina.  No fever, night sweats, weight loss, h/o cancer, IVDU.  RICE protocol and pain medicine indicated and discussed with patient.  Evaluation does not show pathology that would require ongoing emergent intervention or inpatient treatment. Pt is hemodynamically stable and mentating appropriately. Discussed findings and plan with patient/guardian, who agrees with care plan. All questions answered. Return precautions discussed and outpatient follow up given.     Final Clinical Impressions(s) / ED Diagnoses   Final diagnoses:  Lumbar radiculopathy    New Prescriptions Discharge Medication List as of 03/23/2016 10:05 PM    START taking  these medications   Details  methocarbamol (ROBAXIN) 500 MG tablet Can take up to 1-2 tabs every 6 hours PRN PAIN, Print    predniSONE (DELTASONE) 50 MG tablet Take 1 tablet daily with breakfast, Print       I personally performed the services described in this documentation, which was scribed in my presence. The recorded information has been reviewed and is accurate.    Wynetta Emery, PA-C 03/23/16 2300    Pricilla Loveless, MD 03/24/16 [phone]

## 2016-03-23 NOTE — Discharge Instructions (Signed)
Please follow with your primary care doctor in the next 2 days for a check-up. They must obtain records for further management.  ° °Do not hesitate to return to the Emergency Department for any new, worsening or concerning symptoms.  ° °

## 2016-04-05 ENCOUNTER — Ambulatory Visit: Payer: BLUE CROSS/BLUE SHIELD | Admitting: Diagnostic Neuroimaging

## 2016-04-06 ENCOUNTER — Encounter: Payer: Self-pay | Admitting: Diagnostic Neuroimaging

## 2016-04-18 ENCOUNTER — Encounter: Payer: Self-pay | Admitting: Diagnostic Neuroimaging

## 2016-04-18 ENCOUNTER — Ambulatory Visit (INDEPENDENT_AMBULATORY_CARE_PROVIDER_SITE_OTHER): Payer: BLUE CROSS/BLUE SHIELD | Admitting: Diagnostic Neuroimaging

## 2016-04-18 VITALS — BP 114/86 | HR 84 | Ht 63.0 in | Wt 220.0 lb

## 2016-04-18 DIAGNOSIS — R0683 Snoring: Secondary | ICD-10-CM

## 2016-04-18 DIAGNOSIS — R42 Dizziness and giddiness: Secondary | ICD-10-CM | POA: Diagnosis not present

## 2016-04-18 DIAGNOSIS — G4710 Hypersomnia, unspecified: Secondary | ICD-10-CM

## 2016-04-18 DIAGNOSIS — R519 Headache, unspecified: Secondary | ICD-10-CM

## 2016-04-18 DIAGNOSIS — R51 Headache: Secondary | ICD-10-CM | POA: Diagnosis not present

## 2016-04-18 DIAGNOSIS — H538 Other visual disturbances: Secondary | ICD-10-CM

## 2016-04-18 DIAGNOSIS — Z6839 Body mass index (BMI) 39.0-39.9, adult: Secondary | ICD-10-CM

## 2016-04-18 MED ORDER — RIZATRIPTAN BENZOATE 10 MG PO TBDP: 10.0000 mg | ORAL_TABLET | ORAL | 11 refills | Status: AC | PRN

## 2016-04-18 MED ORDER — TOPIRAMATE 50 MG PO TABS: 50.0000 mg | ORAL_TABLET | Freq: Two times a day (BID) | ORAL | 12 refills | Status: AC

## 2016-04-18 NOTE — Patient Instructions (Signed)
Thank you for coming to see [place] at [facility]. I hope we have been able to provide you high quality care today.  You may receive a patient satisfaction survey over the next few weeks. We would appreciate your feedback and comments so that we may continue to improve ourselves and the health of our patients.  TESTING - check MRI brain   MIGRAINE PREVENTION - start topiramate 45m at bedtime; after 1 week increase to twice a day; drink plenty of water  MIGRAINE RESCUE - rizatriptan 152mas needed for breakthrough headache; may repeat x 1 after 2 hours; max 2 tabs per day or 8 per month  BLURRED VISION - follow up with eye doctor re: blurred vision   SNORING/SLEEPINESS - check sleep study consult (BMI 39, snoring, daytime sleepiness)   ~~~~~~~~~~~~~~~~~~~~~~~~~~~~~~~~~~~~~~~~~~~~~~~~~~~~~~~~~~~~~~~~~  DR. PENUMALLI'S GUIDE TO HAPPY AND HEALTHY LIVING These are some of my general health and wellness recommendations. Some of them may apply to you better than others. Please use common sense as you try these suggestions and feel free to ask me any questions.   ACTIVITY/FITNESS Mental, social, emotional and physical stimulation are very important for brain and body health. Try learning a new activity (arts, music, language, sports, games).  Keep moving your body to the best of your abilities. You can do this at home, inside or outside, the park, community center, gym or anywhere you like. Consider a physical therapist or personal trainer to get started. Consider the app Sworkit. Fitness trackers such as smart-watches, smart-phones or Fitbits can help as well.   NUTRITION Eat more plants: colorful vegetables, nuts, seeds and berries.  Eat less sugar, salt, preservatives and processed foods.  Avoid toxins such as cigarettes and alcohol.  Drink water when you are thirsty. Warm water with a slice of lemon is an excellent morning drink to start the day.  Consider these  websites for more information The Nutrition Source (hthttps://www.henry-hernandez.biz/[employer] Nutrition (wwWindowBlog.ch  RELAXATION Consider practicing mindfulness meditation or other relaxation techniques such as deep breathing, prayer, yoga, tai chi, massage. See website mindful.org or the apps Headspace or Calm to help get started.   SLEEP Try to get at least 7-8+ hours sleep per day. Regular exercise and reduced caffeine will help you sleep better. Practice good sleep hygeine techniques. See website sleep.org for more information.   PLANNING Prepare estate planning, living will, healthcare POA documents. Sometimes this is best planned with the help of an attorney. Theconversationproject.org and agingwithdignity.org are excellent resources.

## 2016-04-18 NOTE — Progress Notes (Signed)
[facility]  PATIENT: Diane Fletcher DOB: 03/19/1985  REFERRING CLINICIAN: Doneen PoissonM Mack HISTORY FROM: patient  REASON FOR VISIT: new consult    HISTORICAL  CHIEF COMPLAINT:  Chief Complaint  Patient presents with  . Headache    rm 7, New Pt, "migraines x 4-5 years, take Advil 800 mg as needed"    HISTORY OF PRESENT ILLNESS:   31 year old female here for evaluation of headaches.  Patient had onset of headaches that she 31 years old, with pounding, throbbing sensation, sensitivity to light, global pain. Patient did not seek medical attention that time. She typically took over-the-counter medications, lay down for a nap, and headaches would resolve.  At 31 years old, patient had change in her headaches with more severe pounding throbbing headaches, sometimes unilateral, sometimes bilateral, associated with photophobia, phonophobia, nausea, vomiting, dizziness, vision changes. In the past few months she is having increasing problems with blurred vision. She has not seen an eye doctor recently. Triggering factors include menstrual cycle, skipping meals, reducing caffeine, stress. Patient drinks at least 2 L of Coca-Cola per day. She also drinks 1 coffee 3 times per week. Patient's brother has migraine headaches.  No warning sign, aura, vision or numbness changes prior to onset of headaches.  Patient has 3 "regular" headaches per week with no migraine features. She has at least one migraine headache per week. She has at least 2 severe headaches per month which may result in missing work or going to the emergency room. She has been using ibuprofen as needed for headache management.     REVIEW OF SYSTEMS: Full 14 system review of systems performed and negative with exception of: Double vision blurred vision cough wheezing short of breath abdominal pain excessive thirst swollen lymph nodes headaches snoring and excessive daytime sleepiness neck pain stiffness back pain achy  muscle depression anxiety.   ALLERGIES: No Known Allergies  HOME MEDICATIONS: Outpatient Medications Prior to Visit  Medication Sig Dispense Refill  . amoxicillin-clavulanate (AUGMENTIN) 875-125 MG tablet Take 1 tablet by mouth 2 (two) times daily. 20 tablet 0  . cephALEXin (KEFLEX) 500 MG capsule Take 1 capsule (500 mg total) by mouth 4 (four) times daily. 20 capsule 0  . Cetirizine HCl (ZYRTEC ALLERGY) 10 MG CAPS Take 1 capsule (10 mg total) by mouth daily. 15 capsule 0  . cetirizine (ZYRTEC) 10 MG tablet Take 10 mg by mouth daily.    Marland Kitchen. aspirin-acetaminophen-caffeine (EXCEDRIN MIGRAINE) 250-250-65 MG per tablet Take 2 tablets by mouth every 6 (six) hours as needed for headache (headache).     Marland Kitchen. HYDROcodone-acetaminophen (NORCO) 5-325 MG tablet Take 1 tablet by mouth every 6 (six) hours as needed. (Patient not taking: Reported on 04/18/2016) 15 tablet 0  . methocarbamol (ROBAXIN) 500 MG tablet Can take up to 1-2 tabs every 6 hours PRN PAIN (Patient not taking: Reported on 04/18/2016) 20 tablet 0  . predniSONE (DELTASONE) 50 MG tablet Take 1 tablet daily with breakfast (Patient not taking: Reported on 04/18/2016) 5 tablet 0   No facility-administered medications prior to visit.     PAST MEDICAL HISTORY: Past Medical History:  Diagnosis Date  . Migraines   . Ovarian cyst     PAST SURGICAL HISTORY: History reviewed. No pertinent surgical history.  FAMILY HISTORY: Family History  Problem Relation Age of Onset  . Diabetes Other     SOCIAL HISTORY:  Social History   Social History  . Marital status: Single    Spouse name: N/A  . Number  of children: 0  . Years of education: 10   Occupational History  .      NA   Social History Main Topics  . Smoking status: Current Every Day Smoker    Packs/day: 1.00    Types: Cigarettes  . Smokeless tobacco: Never Used  . Alcohol use No  . Drug use: Yes    Types: Marijuana     Comment: 04/18/16 avg 3 daily  . Sexual activity: Not on  file   Other Topics Concern  . Not on file   Social History Narrative   Lives with father   Caffeine- coffee, 1 daily; sodas, avg 2 L daily Coke      PHYSICAL EXAM  GENERAL EXAM/CONSTITUTIONAL: Vitals:  Vitals:   04/18/16 1537  BP: 114/86  Pulse: 84   Body mass index is 38.97 kg/m. No exam data present  Patient is in no distress; well developed, nourished and groomed; neck is supple  CARDIOVASCULAR:  Examination of carotid arteries is normal; no carotid bruits  Regular rate and rhythm, no murmurs  Examination of peripheral vascular system by observation and palpation is normal  EYES:  Ophthalmoscopic exam of optic discs and posterior segments is normal; no papilledema or hemorrhages  MUSCULOSKELETAL:  Gait, strength, tone, movements noted in Neurologic exam below  NEUROLOGIC: MENTAL STATUS:  No flowsheet data found.  awake, alert, oriented to person, place and time  recent and remote memory intact  normal attention and concentration  language fluent, comprehension intact, naming intact,   fund of knowledge appropriate  CRANIAL NERVE:   2nd - no papilledema on fundoscopic exam  2nd, 3rd, 4th, 6th - pupils equal and reactive to light, visual fields full to confrontation, extraocular muscles intact, no nystagmus  5th - facial sensation symmetric  7th - facial strength symmetric  8th - hearing intact  9th - palate elevates symmetrically, uvula midline  11th - shoulder shrug symmetric  12th - tongue protrusion midline  MOTOR:   normal bulk and tone, full strength in the BUE, BLE  SENSORY:   normal and symmetric to light touch, pinprick, temperature, vibration  COORDINATION:   finger-nose-finger, fine finger movements normal  REFLEXES:   deep tendon reflexes present and symmetric  GAIT/STATION:   narrow based gait; able to walk  tandem; romberg is negative    DIAGNOSTIC DATA (LABS, IMAGING, TESTING) - I reviewed patient  records, labs, notes, testing and imaging myself where available.  Lab Results  Component Value Date   WBC 14.2 (H) 09/10/2015   HGB 13.9 09/10/2015   HCT 42.1 09/10/2015   MCV 89.2 09/10/2015   PLT 240 09/10/2015      Component Value Date/Time   NA 138 09/10/2015 1805   K 3.5 09/10/2015 1805   CL 108 09/10/2015 1805   CO2 23 09/10/2015 1805   GLUCOSE 83 09/10/2015 1805   BUN 7 09/10/2015 1805   CREATININE 0.64 09/10/2015 1805   CALCIUM 8.3 (L) 09/10/2015 1805   GFRNONAA >60 09/10/2015 1805   GFRAA >60 09/10/2015 1805   No results found for: CHOL, HDL, LDLCALC, LDLDIRECT, TRIG, CHOLHDL No results found for: RUEA5W No results found for: VITAMINB12 No results found for: TSH     ASSESSMENT AND PLAN  31 y.o. year old female here with History of headaches since age 20 years old, worsening around age 16 years old, with more vision problems and headache pain in the last few months. Neurologic examination is unremarkable. May represent progression of her migraine  headaches. Patient has never had imaging study of the brain, and due to worsening symptoms recently will check MRI of the brain to rule out other secondary causes.   Dx:  1. New onset headache   2. Blurred vision   3. Dizziness   4. Snoring   5. BMI 39.0-39.9,adult   6. Excessive sleepiness      PLAN:  TESTING - check MRI brain (worsening headaches, vision changes; rule out CNS vascular, mass, inflamm causes of headache)  MIGRAINE PREVENTION - start topiramate 50mg  at bedtime; after 1 week increase to twice a day; drink plenty of water  MIGRAINE RESCUE - rizatriptan 10mg  as needed for breakthrough headache; may repeat x 1 after 2 hours; max 2 tabs per day or 8 per month  BLURRED VISION - follow up with eye doctor re: blurred vision   SNORING/SLEEPINESS - check sleep study consult (BMI 39, snoring, daytime sleepiness)   Orders Placed This Encounter  Procedures  . MR BRAIN W WO CONTRAST  . CBC with  Differential/Platelet  . Comprehensive metabolic panel  . TSH  . Hemoglobin A1c  . Ambulatory referral to Sleep Studies   Meds ordered this encounter  Medications  . topiramate (TOPAMAX) 50 MG tablet    Sig: Take 1 tablet (50 mg total) by mouth 2 (two) times daily.    Dispense:  60 tablet    Refill:  12  . rizatriptan (MAXALT-MLT) 10 MG disintegrating tablet    Sig: Take 1 tablet (10 mg total) by mouth as needed for migraine. May repeat in 2 hours if needed    Dispense:  9 tablet    Refill:  11   Return in about 3 months (around 07/19/2016).    Suanne Marker, MD 04/18/2016, 4:04 PM Certified in Neurology, Neurophysiology and Neuroimaging  [facility] [address], [address] [place], [place] [address] [phone]

## 2016-04-19 LAB — COMPREHENSIVE METABOLIC PANEL
ALBUMIN: 4 g/dL (ref 3.5–5.5)
ALK PHOS: 65 IU/L (ref 39–117)
ALT: 8 IU/L (ref 0–32)
AST: 10 IU/L (ref 0–40)
Albumin/Globulin Ratio: 1.4 (ref 1.2–2.2)
BUN/Creatinine Ratio: 14 (ref 9–23)
BUN: 9 mg/dL (ref 6–20)
Bilirubin Total: <0.2 mg/dL (ref 0.0–1.2)
CHLORIDE: 102 mmol/L (ref 96–106)
CO2: 25 mmol/L (ref 18–29)
CREATININE: 0.65 mg/dL (ref 0.57–1.00)
Calcium: 8.9 mg/dL (ref 8.7–10.2)
GFR, EST AFRICAN AMERICAN: 140 mL/min/{1.73_m2} (ref >59)
GFR, EST NON AFRICAN AMERICAN: 121 mL/min/{1.73_m2} (ref >59)
Globulin, Total: 2.8 g/dL (ref 1.5–4.5)
Glucose: 87 mg/dL (ref 65–99)
POTASSIUM: 5.2 mmol/L (ref 3.5–5.2)
SODIUM: 139 mmol/L (ref 134–144)
Total Protein: 6.8 g/dL (ref 6.0–8.5)

## 2016-04-19 LAB — CBC WITH DIFFERENTIAL/PLATELET
Basophils Absolute: 0 10*3/uL (ref 0.0–0.2)
Basos: 0 %
EOS (ABSOLUTE): 0.1 10*3/uL (ref 0.0–0.4)
EOS: 1 %
HEMATOCRIT: 41.5 % (ref 34.0–46.6)
Hemoglobin: 14.1 g/dL (ref 11.1–15.9)
IMMATURE GRANS (ABS): 0 10*3/uL (ref 0.0–0.1)
IMMATURE GRANULOCYTES: 0 %
LYMPHS ABS: 3.2 10*3/uL — AB (ref 0.7–3.1)
Lymphs: 33 %
MCH: 29.9 pg (ref 26.6–33.0)
MCHC: 34 g/dL (ref 31.5–35.7)
MCV: 88 fL (ref 79–97)
MONOS ABS: 1.1 10*3/uL — AB (ref 0.1–0.9)
Monocytes: 11 %
NEUTROS PCT: 55 %
Neutrophils Absolute: 5.4 10*3/uL (ref 1.4–7.0)
Platelets: 362 10*3/uL (ref 150–379)
RBC: 4.71 x10E6/uL (ref 3.77–5.28)
RDW: 13.2 % (ref 12.3–15.4)
WBC: 9.7 10*3/uL (ref 3.4–10.8)

## 2016-04-19 LAB — TSH: TSH: 0.972 u[IU]/mL (ref 0.450–4.500)

## 2016-04-19 LAB — HEMOGLOBIN A1C
ESTIMATED AVERAGE GLUCOSE: 97 mg/dL
Hgb A1c MFr Bld: 5 % (ref 4.8–5.6)

## 2016-04-21 ENCOUNTER — Telehealth: Payer: Self-pay | Admitting: *Deleted

## 2016-04-21 NOTE — Telephone Encounter (Signed)
Per Dr Marjory LiesPenumalli, spoke with patient and informed her that her lab results are unremarkable. She is scheduled for sleep consult. Advised she needs to schedule MRI; [facility]. She verbalized understanding,, appreciation.

## 2016-05-03 ENCOUNTER — Ambulatory Visit: Payer: BLUE CROSS/BLUE SHIELD

## 2016-05-05 ENCOUNTER — Encounter (HOSPITAL_COMMUNITY): Payer: Self-pay

## 2016-05-05 ENCOUNTER — Emergency Department (HOSPITAL_COMMUNITY)
Admission: EM | Admit: 2016-05-05 | Discharge: 2016-05-05 | Disposition: A | Discharge status: Home / Self Care | Payer: BLUE CROSS/BLUE SHIELD | Attending: Emergency Medicine | Admitting: Emergency Medicine

## 2016-05-05 DIAGNOSIS — F17210 Nicotine dependence, cigarettes, uncomplicated: Secondary | ICD-10-CM | POA: Insufficient documentation

## 2016-05-05 DIAGNOSIS — G43801 Other migraine, not intractable, with status migrainosus: Secondary | ICD-10-CM | POA: Insufficient documentation

## 2016-05-05 DIAGNOSIS — Z79899 Other long term (current) drug therapy: Secondary | ICD-10-CM | POA: Insufficient documentation

## 2016-05-05 MED ORDER — KETOROLAC TROMETHAMINE 30 MG/ML IJ SOLN
30.0000 mg | Freq: Once | INTRAMUSCULAR | Status: DC
  Filled 2016-05-05: qty 1

## 2016-05-05 MED ORDER — ASPIRIN-ACETAMINOPHEN-CAFFEINE 250-250-65 MG PO TABS
2.0000 | ORAL_TABLET | Freq: Once | ORAL | Status: DC
  Filled 2016-05-05: qty 2

## 2016-05-05 MED ORDER — SUMATRIPTAN SUCCINATE 6 MG/0.5ML ~~LOC~~ SOLN
6.0000 mg | Freq: Once | SUBCUTANEOUS | Status: AC
  Administered 2016-05-05: 6 mg via SUBCUTANEOUS
  Filled 2016-05-05: qty 0.5

## 2016-05-05 MED ORDER — METOCLOPRAMIDE HCL 5 MG/ML IJ SOLN
10.0000 mg | Freq: Once | INTRAMUSCULAR | Status: AC
  Administered 2016-05-05: 10 mg via INTRAMUSCULAR
  Filled 2016-05-05: qty 2

## 2016-05-05 NOTE — ED Provider Notes (Addendum)
[facility]-EMERGENCY DEPT Provider Note   CSN: [id number] Arrival date & time: 05/05/16  1739     History   Chief Complaint Chief Complaint  Patient presents with  . Migraine    HPI Diane Fletcher is a 31 y.o. female.  Patient c/o onset frontal headache this AM. Was gradual in onset, pain constant, dull, throbbing. +nausea and photophobia. States same as with prior migraines. Has taken no meds at home.  Has vomited 3 times, no bloody or bilious emesis. No sinus drainage or congestion. No uri c/o. No fever or chills. Denies neck stiffness. No recent head injury, trauma, fall or syncope. No numbness/weakness. No change in speech or vision. Indicates has not taken any meds today.    The history is provided by the patient.  Migraine  Associated symptoms include headaches. Pertinent negatives include no chest pain, no abdominal pain and no shortness of breath.    Past Medical History:  Diagnosis Date  . Migraines   . Ovarian cyst     There are no active problems to display for this patient.   History reviewed. No pertinent surgical history.  OB History    No data available       Home Medications    Prior to Admission medications   Medication Sig Start Date End Date Taking? Authorizing Provider  cetirizine (ZYRTEC) 10 MG tablet Take 10 mg by mouth daily.    Historical Provider, MD  ibuprofen (ADVIL,MOTRIN) 200 MG tablet Take 200 mg by mouth every 6 (six) hours as needed.    Historical Provider, MD  rizatriptan (MAXALT-MLT) 10 MG disintegrating tablet Take 1 tablet (10 mg total) by mouth as needed for migraine. May repeat in 2 hours if needed 04/18/16   Suanne Marker, MD  topiramate (TOPAMAX) 50 MG tablet Take 1 tablet (50 mg total) by mouth 2 (two) times daily. 04/18/16   Suanne Marker, MD    Family History Family History  Problem Relation Age of Onset  . Diabetes Other     Social History Social History  Substance Use Topics  . Smoking status: Current Every  Day Smoker    Packs/day: 1.00    Types: Cigarettes  . Smokeless tobacco: Never Used  . Alcohol use No     Allergies   Patient has no known allergies.   Review of Systems Review of Systems  Constitutional: Negative for chills and fever.  HENT: Negative for sinus pain and sore throat.   Eyes: Negative for pain and visual disturbance.  Respiratory: Negative for shortness of breath.   Cardiovascular: Negative for chest pain.  Gastrointestinal: Positive for nausea and vomiting. Negative for abdominal pain.  Genitourinary: Negative for flank pain.  Musculoskeletal: Negative for back pain and neck pain.  Skin: Negative for rash.  Neurological: Positive for headaches. Negative for syncope, weakness and numbness.  Hematological: Does not bruise/bleed easily.  Psychiatric/Behavioral: Negative for confusion.     Physical Exam Updated Vital Signs BP (!) 145/87 (BP Location: Left Arm)   Pulse 94   Temp 97.9 F (36.6 C) (Oral)   Resp 20   LMP 04/27/2016   SpO2 100%   Physical Exam  Constitutional: She is oriented to person, place, and time. She appears well-developed and well-nourished. No distress.  HENT:  Head: Atraumatic.  Nose: Nose normal.  Mouth/Throat: Oropharynx is clear and moist.  No sinus or temporal tenderness.  Eyes: Conjunctivae and EOM are normal. Pupils are equal, round, and reactive to light. No scleral icterus.  Neck: Neck supple. No tracheal deviation present. No thyromegaly present.  No stiffness or rigidity.   Cardiovascular: Normal rate, regular rhythm, normal heart sounds and intact distal pulses.  Exam reveals no gallop and no friction rub.   No murmur heard. Pulmonary/Chest: Effort normal and breath sounds normal. No respiratory distress.  Abdominal: Soft. Normal appearance and bowel sounds are normal. She exhibits no distension. There is no tenderness.  Genitourinary:  Genitourinary Comments: No cva tenderness.  Musculoskeletal: Normal range of  motion. She exhibits no edema or tenderness.  Neurological: She is alert and oriented to person, place, and time. No cranial nerve deficit.  Speech clear/fluent. Motor intact bilaterally. Steady gait.   Skin: Skin is warm and dry. No rash noted. She is not diaphoretic.  Psychiatric: She has a normal mood and affect.  Nursing note and vitals reviewed.    ED Treatments / Results  Labs (all labs ordered are listed, but only abnormal results are displayed) Labs Reviewed - No data to display  EKG  EKG Interpretation None       Radiology No results found.  Procedures Procedures (including critical care time)  Medications Ordered in ED Medications  SUMAtriptan (IMITREX) injection 6 mg (not administered)  metoCLOPramide (REGLAN) injection 10 mg (not administered)     Initial Impression / Assessment and Plan / ED Course  I have reviewed the triage vital signs and the nursing notes.  Pertinent labs & imaging results that were available during my care of the patient were reviewed by me and considered in my medical decision making (see chart for details).  Pt drove self to ED.  imitrex sq. reglan im.   Po fluids.  Reviewed nursing notes and prior charts for additional history.   Headache mildly improved but persists. Pt drove self, no fam/friend in ED.  toradol im. excedrin po ordered.   On recheck pt angry, using inappropriate language upset that she hadnt been given 'a migraine cocktail'.  I attempted to explain she was given several migraine medications including a couple of the same meds during prior visits. Pt now says she just wants to go home.   Final Clinical Impressions(s) / ED Diagnoses   Final diagnoses:  None    New Prescriptions New Prescriptions   No medications on file         Cathren Laine, MD 05/05/16 [phone]

## 2016-05-05 NOTE — ED Triage Notes (Signed)
Patient c/o headache since 0600 today with blurred, sensitivity to light and sound. Patient also c/o neck stiffness as well.

## 2016-05-05 NOTE — Discharge Instructions (Signed)
It was our pleasure to provide your ER care today - we hope that you feel better.  Rest. Drink plenty of fluids.   Take motrin or aleve as need.   Follow up with primary care doctor Monday if symptoms fail to improve/resolve.  Return to ER if worse, new symptoms, fevers, persistent vomiting, intractable pain, other concern.

## 2016-05-09 ENCOUNTER — Encounter: Payer: Self-pay | Admitting: Neurology

## 2016-05-09 ENCOUNTER — Institutional Professional Consult (permissible substitution): Payer: BLUE CROSS/BLUE SHIELD | Admitting: Neurology

## 2016-05-09 ENCOUNTER — Telehealth: Payer: Self-pay

## 2016-05-09 NOTE — Telephone Encounter (Signed)
Pt did not show for their appt with Dr. Athar today.  

## 2016-05-17 ENCOUNTER — Other Ambulatory Visit: Payer: BLUE CROSS/BLUE SHIELD

## 2016-07-31 ENCOUNTER — Ambulatory Visit: Payer: BLUE CROSS/BLUE SHIELD | Admitting: Diagnostic Neuroimaging

## 2016-08-01 ENCOUNTER — Encounter: Payer: Self-pay | Admitting: Diagnostic Neuroimaging

## 2016-11-08 ENCOUNTER — Emergency Department (HOSPITAL_COMMUNITY): Admission: EM | Admit: 2016-11-08 | Discharge: 2016-11-08 | Discharge status: Against Medical Advice | Payer: Self-pay

## 2016-11-08 NOTE — ED Notes (Signed)
Called for triage  No response from lobby 

## 2016-11-08 NOTE — ED Notes (Signed)
Called pt, no response. 

## 2016-11-09 ENCOUNTER — Encounter (HOSPITAL_COMMUNITY): Payer: Self-pay | Admitting: Emergency Medicine

## 2016-11-09 ENCOUNTER — Emergency Department (HOSPITAL_COMMUNITY)
Admission: EM | Admit: 2016-11-09 | Discharge: 2016-11-09 | Disposition: A | Discharge status: Home / Self Care | Payer: Self-pay | Attending: Emergency Medicine | Admitting: Emergency Medicine

## 2016-11-09 ENCOUNTER — Emergency Department (HOSPITAL_COMMUNITY): Payer: Self-pay

## 2016-11-09 DIAGNOSIS — J069 Acute upper respiratory infection, unspecified: Secondary | ICD-10-CM | POA: Insufficient documentation

## 2016-11-09 DIAGNOSIS — B9789 Other viral agents as the cause of diseases classified elsewhere: Secondary | ICD-10-CM | POA: Insufficient documentation

## 2016-11-09 DIAGNOSIS — F17210 Nicotine dependence, cigarettes, uncomplicated: Secondary | ICD-10-CM | POA: Insufficient documentation

## 2016-11-09 LAB — INFLUENZA PANEL BY PCR (TYPE A & B)
Influenza A By PCR: NEGATIVE
Influenza B By PCR: NEGATIVE

## 2016-11-09 MED ORDER — PROCHLORPERAZINE EDISYLATE 5 MG/ML IJ SOLN
10.0000 mg | Freq: Once | INTRAMUSCULAR | Status: AC
  Administered 2016-11-09: 10 mg via INTRAVENOUS
  Filled 2016-11-09: qty 2

## 2016-11-09 MED ORDER — DIPHENHYDRAMINE HCL 50 MG/ML IJ SOLN
25.0000 mg | Freq: Once | INTRAMUSCULAR | Status: AC
  Administered 2016-11-09: 25 mg via INTRAVENOUS
  Filled 2016-11-09: qty 1

## 2016-11-09 MED ORDER — KETOROLAC TROMETHAMINE 30 MG/ML IJ SOLN
30.0000 mg | Freq: Once | INTRAMUSCULAR | Status: AC
  Administered 2016-11-09: 30 mg via INTRAVENOUS
  Filled 2016-11-09: qty 1

## 2016-11-09 MED ORDER — GUAIFENESIN ER 1200 MG PO TB12: 1.0000 | ORAL_TABLET | Freq: Two times a day (BID) | ORAL | 0 refills | Status: AC

## 2016-11-09 MED ORDER — SODIUM CHLORIDE 0.9 % IV BOLUS (SEPSIS)
2000.0000 mL | Freq: Once | INTRAVENOUS | Status: AC
  Administered 2016-11-09: 2000 mL via INTRAVENOUS

## 2016-11-09 MED ORDER — IBUPROFEN 800 MG PO TABS: 800.0000 mg | ORAL_TABLET | Freq: Three times a day (TID) | ORAL | 0 refills | Status: DC | PRN

## 2016-11-09 MED ORDER — PROMETHAZINE-DM 6.25-15 MG/5ML PO SYRP: 5.0000 mL | ORAL_SOLUTION | Freq: Four times a day (QID) | ORAL | 0 refills | Status: AC | PRN

## 2016-11-09 NOTE — Discharge Instructions (Signed)
Return here as needed.  Follow-up with a primary doctor, increase her fluid intake and rest as much as possible.  Your testing did not show any significant abnormalities

## 2016-11-09 NOTE — ED Provider Notes (Signed)
[facility]-EMERGENCY DEPT Provider Note   CSN: [id number] Arrival date & time: 11/09/16  1019     History   Chief Complaint Chief Complaint  Patient presents with  . Multiple Complaints    HPI Diane Fletcher is a 31 y.o. female.  HPI Patient presents to the emergency department with body aches, cough, nausea.  Patient also complains of headache The patient states that she also feels like she has had fever but did not take her temperature.  Patient states she did not take any medications prior to arrival.  She states nothing seems make the condition better or worse. The patient denies chest pain, shortness of breath, blurred vision, neck pain,weakness, numbness, dizziness, anorexia, edema, abdominal pain, nausea, vomiting, diarrhea, rash, back pain, dysuria, hematemesis, bloody stool, near syncope, or syncope. Past Medical History:  Diagnosis Date  . Migraines   . Ovarian cyst     There are no active problems to display for this patient.   History reviewed. No pertinent surgical history.  OB History    No data available       Home Medications    Prior to Admission medications   Medication Sig Start Date End Date Taking? Authorizing Provider  ibuprofen (ADVIL,MOTRIN) 200 MG tablet Take 800 mg by mouth daily as needed (MIGRAINE).   Yes [provider]  rizatriptan (MAXALT-MLT) 10 MG disintegrating tablet Take 1 tablet (10 mg total) by mouth as needed for migraine. May repeat in 2 hours if needed Patient not taking: Reported on 05/05/2016 04/18/16   Penumalli, Glenford Bayley, MD  topiramate (TOPAMAX) 50 MG tablet Take 1 tablet (50 mg total) by mouth 2 (two) times daily. Patient not taking: Reported on 05/05/2016 04/18/16   Suanne Marker, MD    Family History Family History  Problem Relation Age of Onset  . Diabetes Other     Social History Social History  Substance Use Topics  . Smoking status: Current Every Day Smoker    Packs/day: 1.00    Types: Cigarettes  .  Smokeless tobacco: Never Used  . Alcohol use No     Allergies   Pollen extract   Review of Systems Review of Systems  All other systems negative except as documented in the HPI. All pertinent positives and negatives as reviewed in the HPI. Physical Exam Updated Vital Signs BP 115/87 (BP Location: Left Arm)   Pulse (!) 126   Temp 99.2 F (37.3 C) (Oral)   Resp 18   Ht  (1.6 m)   Wt 99.8 kg (220 lb)   LMP 09/12/2016   SpO2 99%   BMI 38.97 kg/m   Physical Exam  Constitutional: She is oriented to person, place, and time. She appears well-developed and well-nourished. No distress.  HENT:  Head: Normocephalic and atraumatic.  Mouth/Throat: Oropharynx is clear and moist.  Eyes: Pupils are equal, round, and reactive to light.  Neck: Trachea normal and normal range of motion. Neck supple. No muscular tenderness present. No neck rigidity. Normal range of motion present. No Brudzinski's sign noted.  Cardiovascular: Normal rate, regular rhythm and normal heart sounds.  Exam reveals no gallop and no friction rub.   No murmur heard. Pulmonary/Chest: Effort normal and breath sounds normal. No stridor. No respiratory distress. She has no wheezes.  Lymphadenopathy:    She has no cervical adenopathy.  Neurological: She is alert and oriented to person, place, and time. She exhibits normal muscle tone. Coordination normal.  Skin: Skin is warm and dry. Capillary  refill takes less than 2 seconds. No rash noted. No erythema.  Psychiatric: She has a normal mood and affect. Her behavior is normal.  Nursing note and vitals reviewed.    ED Treatments / Results  Labs (all labs ordered are listed, but only abnormal results are displayed) Labs Reviewed  INFLUENZA PANEL BY PCR (TYPE A & B)    EKG  EKG Interpretation None       Radiology Dg Chest 2 View  Result Date: 11/09/2016 CLINICAL DATA:  Chest pain. EXAM: CHEST  2 VIEW COMPARISON:  None. FINDINGS: The heart size and  mediastinal contours are within normal limits. Both lungs are clear. No pneumothorax or pleural effusion is noted. The visualized skeletal structures are unremarkable. IMPRESSION: No active cardiopulmonary disease. Electronically Signed   By: Lupita Raider, M.D.   On: 11/09/2016 11:02    Procedures Procedures (including critical care time)  Medications Ordered in ED Medications  sodium chloride 0.9 % bolus 2,000 mL (2,000 mLs Intravenous New Bag/Given 11/09/16 1210)     Initial Impression / Assessment and Plan / ED Course  I have reviewed the triage vital signs and the nursing notes.  Pertinent labs & imaging results that were available during my care of the patient were reviewed by me and considered in my medical decision making (see chart for details).     The patient has a viral URI with cough.  We will have the patient follow up with her primary doctor, told to return here as needed.  Patient was given IV fluids.  Her vital signs have improved.  Patient is advised to return here as needed.   Final Clinical Impressions(s) / ED Diagnoses   Final diagnoses:  None    New Prescriptions New Prescriptions   No medications on file     Charlestine Night, Cordelia Poche 11/09/16 1331    Rolland Porter, MD 11/19/16 [phone]

## 2016-11-09 NOTE — ED Triage Notes (Signed)
Pt complaint of generalized body aches, headache, nausea, and productive cough for 3 days.

## 2017-08-03 ENCOUNTER — Emergency Department (HOSPITAL_COMMUNITY)
Admission: EM | Admit: 2017-08-03 | Discharge: 2017-08-03 | Disposition: A | Discharge status: Home / Self Care | Payer: Self-pay | Attending: Emergency Medicine | Admitting: Emergency Medicine

## 2017-08-03 ENCOUNTER — Encounter (HOSPITAL_COMMUNITY): Payer: Self-pay | Admitting: Emergency Medicine

## 2017-08-03 ENCOUNTER — Other Ambulatory Visit: Payer: Self-pay

## 2017-08-03 DIAGNOSIS — F17210 Nicotine dependence, cigarettes, uncomplicated: Secondary | ICD-10-CM | POA: Insufficient documentation

## 2017-08-03 DIAGNOSIS — F1210 Cannabis abuse, uncomplicated: Secondary | ICD-10-CM | POA: Insufficient documentation

## 2017-08-03 DIAGNOSIS — M25512 Pain in left shoulder: Secondary | ICD-10-CM | POA: Insufficient documentation

## 2017-08-03 DIAGNOSIS — M62838 Other muscle spasm: Secondary | ICD-10-CM | POA: Insufficient documentation

## 2017-08-03 DIAGNOSIS — Z79899 Other long term (current) drug therapy: Secondary | ICD-10-CM | POA: Insufficient documentation

## 2017-08-03 LAB — POC URINE PREG, ED: Preg Test, Ur: NEGATIVE

## 2017-08-03 MED ORDER — METHOCARBAMOL 500 MG PO TABS: 500.0000 mg | ORAL_TABLET | Freq: Two times a day (BID) | ORAL | 0 refills | Status: AC

## 2017-08-03 MED ORDER — KETOROLAC TROMETHAMINE 60 MG/2ML IM SOLN
30.0000 mg | Freq: Once | INTRAMUSCULAR | Status: AC
  Administered 2017-08-03: 30 mg via INTRAMUSCULAR
  Filled 2017-08-03: qty 2

## 2017-08-03 MED ORDER — HYDROCODONE-ACETAMINOPHEN 5-325 MG PO TABS: 1.0000 | ORAL_TABLET | Freq: Four times a day (QID) | ORAL | 0 refills | Status: DC | PRN

## 2017-08-03 MED ORDER — LIDOCAINE 5 % EX PTCH: 1.0000 | MEDICATED_PATCH | CUTANEOUS | 0 refills | Status: AC

## 2017-08-03 NOTE — ED Provider Notes (Signed)
[facility]-EMERGENCY DEPT Provider Note   CSN: [id number] Arrival date & time: 08/03/17  1043     History   Chief Complaint Chief Complaint  Patient presents with  . Neck Pain    HPI Eleah Lahaie is a 32 y.o. female.  HPI   Evalisse Prajapati is a 32 y.o. female, with a history of migraines, presenting to the ED with right-sided neck and trapezius pain along with stiffness in these muscles beginning 2 days ago.  She took 1 dose of Advil 2 days ago.  Denies falls/injuries, weakness, numbness, fever, or any other complaints.   Past Medical History:  Diagnosis Date  . Migraines   . Ovarian cyst     There are no active problems to display for this patient.   History reviewed. No pertinent surgical history.   OB History   None      Home Medications    Prior to Admission medications   Medication Sig Start Date End Date Taking? Authorizing Provider  Guaifenesin 1200 MG TB12 Take 1 tablet (1,200 mg total) by mouth 2 (two) times daily. 11/09/16   Lawyer, Cristal Deer, PA-C  HYDROcodone-acetaminophen (NORCO/VICODIN) 5-325 MG tablet Take 1-2 tablets by mouth every 6 (six) hours as needed for severe pain. 08/03/17   Jena Tegeler C, PA-C  ibuprofen (ADVIL,MOTRIN) 800 MG tablet Take 1 tablet (800 mg total) by mouth every 8 (eight) hours as needed. 11/09/16   Lawyer, Cristal Deer, PA-C  lidocaine (LIDODERM) 5 % Place 1 patch onto the skin daily. Remove & Discard patch within 12 hours or as directed by MD 08/03/17   Yasmyn Bellisario C, PA-C  methocarbamol (ROBAXIN) 500 MG tablet Take 1 tablet (500 mg total) by mouth 2 (two) times daily. 08/03/17   Emi Lymon C, PA-C  promethazine-dextromethorphan (PROMETHAZINE-DM) 6.25-15 MG/5ML syrup Take 5 mLs by mouth 4 (four) times daily as needed for cough. 11/09/16   Lawyer, Cristal Deer, PA-C  rizatriptan (MAXALT-MLT) 10 MG disintegrating tablet Take 1 tablet (10 mg total) by mouth as needed for migraine. May repeat in 2 hours if  needed Patient not taking: Reported on 05/05/2016 04/18/16   Penumalli, Glenford Bayley, MD  topiramate (TOPAMAX) 50 MG tablet Take 1 tablet (50 mg total) by mouth 2 (two) times daily. Patient not taking: Reported on 05/05/2016 04/18/16   Suanne Marker, MD    Family History Family History  Problem Relation Age of Onset  . Diabetes Other     Social History Social History   Tobacco Use  . Smoking status: Current Every Day Smoker    Packs/day: 1.00    Types: Cigarettes  . Smokeless tobacco: Never Used  Substance Use Topics  . Alcohol use: No  . Drug use: Yes    Types: Marijuana    Comment: 04/18/16 avg 3 daily     Allergies   Pollen extract   Review of Systems Review of Systems  Constitutional: Negative for fever.  Cardiovascular: Negative for chest pain.  Musculoskeletal: Positive for arthralgias, myalgias and neck pain.  Neurological: Negative for weakness and numbness.     Physical Exam Updated Vital Signs BP (!) 142/89 (BP Location: Right Arm)   Pulse 91   Temp 98 F (36.7 C) (Oral)   Resp 18   LMP 07/04/2017   SpO2 100%   Physical Exam  Constitutional: She appears well-developed and well-nourished. No distress.  HENT:  Head: Normocephalic and atraumatic.  Eyes: Conjunctivae are normal.  Neck: Neck supple.  Cardiovascular: Normal rate, regular  rhythm and intact distal pulses.  Pulmonary/Chest: Effort normal.  Musculoskeletal: She exhibits tenderness.  Tenderness through the right cervical musculature and right trapezius.  Pain with movement of the neck and right shoulder.  Stiffness and tightness in these muscles as well.  No noted swelling, erythema, bruising, deformity, crepitus, or other abnormality  Lymphadenopathy:    She has no cervical adenopathy.  Neurological: She is alert.  Sensation grossly intact to light touch through each of the nerve distributions of the bilateral upper extremities. Abduction and adduction of the fingers intact against  resistance. Grip strength equal bilaterally. Supination and pronation intact against resistance. Strength 5/5 through the cardinal directions of the bilateral wrists. Strength 5/5 with flexion and extension of the bilateral elbows. Patient can touch the thumb to each one of the fingertips without difficulty.   Skin: Skin is warm and dry. Capillary refill takes less than 2 seconds. She is not diaphoretic. No pallor.  Psychiatric: She has a normal mood and affect. Her behavior is normal.  Nursing note and vitals reviewed.    ED Treatments / Results  Labs (all labs ordered are listed, but only abnormal results are displayed) Labs Reviewed  POC URINE PREG, ED    EKG None  Radiology No results found.  Procedures Procedures (including critical care time)  Medications Ordered in ED Medications  ketorolac (TORADOL) injection 30 mg (30 mg Intramuscular Given 08/03/17 1329)     Initial Impression / Assessment and Plan / ED Course  I have reviewed the triage vital signs and the nursing notes.  Pertinent labs & imaging results that were available during my care of the patient were reviewed by me and considered in my medical decision making (see chart for details).     Patient presents with 2 days of pain to the right side of the neck and shoulder.  Neurovascularly intact.  Muscle spasms suspected.  We will initiate a regimen of anti-inflammatory medications, muscle relaxers, and exercises.  She will follow-up with her PCP versus orthopedist. The patient was given instructions for home care as well as return precautions. Patient voices understanding of these instructions, accepts the plan, and is comfortable with discharge.  Final Clinical Impressions(s) / ED Diagnoses   Final diagnoses:  Muscle spasms of neck    ED Discharge Orders        Ordered    methocarbamol (ROBAXIN) 500 MG tablet  2 times daily     08/03/17 1330    lidocaine (LIDODERM) 5 %  Every 24 hours     08/03/17  1330    HYDROcodone-acetaminophen (NORCO/VICODIN) 5-325 MG tablet  Every 6 hours PRN     08/03/17 1330       Anselm PancoastJoy, Jalee Saine C, PA-C 08/03/17 1415    Linwood DibblesKnapp, Jon, MD 08/04/17 1500

## 2017-08-03 NOTE — Discharge Instructions (Addendum)
Expect your soreness to increase over the next 2-3 days. Take it easy, but do not lay around too much as this may make any stiffness worse.  Antiinflammatory medications: Take 600 mg of ibuprofen every 6 hours or 440 mg (over the counter dose) to 500 mg (prescription dose) of naproxen every 12 hours for the next 3 days. After this time, these medications may be used as needed for pain. Take these medications with food to avoid upset stomach. Choose only one of these medications, do not take them together.  Tylenol: Should you continue to have additional pain while taking the ibuprofen or naproxen, you may add in tylenol as needed. Your daily total maximum amount of tylenol from all sources should be limited to 4000mg /day for persons without liver problems, or 2000mg /day for those with liver problems. Vicodin: May take Vicodin as needed for severe pain.  Do not drive or perform other dangerous activities while taking the Vicodin.  Please note that each pill of Vicodin contains 325 mg of Tylenol and the above dosage limits apply. Muscle relaxer: Robaxin is a muscle relaxer and may help loosen stiff muscles. Do not take the Robaxin while driving or performing other dangerous activities.  Lidocaine patches: These are available via either prescription or over-the-counter. The over-the-counter option may be more economical one and are likely just as effective. There are multiple over-the-counter brands, such as Salonpas. Exercises: Be sure to perform the attached exercises starting with three times a week and working up to performing them daily. This is an essential part of preventing long term problems.  Follow up: Follow up with a primary care provider or orthopedic specialist for any future management of these complaints. Be sure to follow up within 7-10 days. Return: Return to the ED should symptoms fail to begin to improve within a week.

## 2017-08-03 NOTE — ED Triage Notes (Signed)
Pt believes "got a crick in my neck" x2 days ago. Right side neck pain; denies injury.

## 2017-08-03 NOTE — ED Notes (Signed)
Bed: WTR8 Expected date:  Expected time:  Means of arrival:  Comments: 

## 2017-08-31 ENCOUNTER — Emergency Department: Payer: Self-pay

## 2017-08-31 ENCOUNTER — Emergency Department
Admission: EM | Admit: 2017-08-31 | Discharge: 2017-08-31 | Disposition: A | Discharge status: Home / Self Care | Payer: Self-pay | Attending: Emergency Medicine | Admitting: Emergency Medicine

## 2017-08-31 DIAGNOSIS — R102 Pelvic and perineal pain: Secondary | ICD-10-CM | POA: Insufficient documentation

## 2017-08-31 LAB — CBC AND DIFFERENTIAL
Absolute NRBC: 0 10*3/uL (ref 0.00–0.00)
Basophils Absolute Automated: 0.03 10*3/uL (ref 0.00–0.08)
Basophils Automated: 0.3 %
Eosinophils Absolute Automated: 0.04 10*3/uL (ref 0.00–0.44)
Eosinophils Automated: 0.4 %
Hematocrit: 40.5 % (ref 34.7–43.7)
Hgb: 13.7 g/dL (ref 11.4–14.8)
Immature Granulocytes Absolute: 0.04 10*3/uL (ref 0.00–0.07)
Immature Granulocytes: 0.4 %
Lymphocytes Absolute Automated: 3.34 10*3/uL — ABNORMAL HIGH (ref 0.42–3.22)
Lymphocytes Automated: 32.9 %
MCH: 29.7 pg (ref 25.1–33.5)
MCHC: 33.8 g/dL (ref 31.5–35.8)
MCV: 87.9 fL (ref 78.0–96.0)
MPV: 10.3 fL (ref 8.9–12.5)
Monocytes Absolute Automated: 1.05 10*3/uL — ABNORMAL HIGH (ref 0.21–0.85)
Monocytes: 10.3 %
Neutrophils Absolute: 5.66 10*3/uL (ref 1.10–6.33)
Neutrophils: 55.7 %
Nucleated RBC: 0 /100 WBC (ref 0.0–0.0)
Platelets: 272 10*3/uL (ref 142–346)
RBC: 4.61 10*6/uL (ref 3.90–5.10)
RDW: 13 % (ref 11–15)
WBC: 10.16 10*3/uL — ABNORMAL HIGH (ref 3.10–9.50)

## 2017-08-31 LAB — URINALYSIS, REFLEX TO MICROSCOPIC EXAM IF INDICATED
Bilirubin, UA: NEGATIVE
Blood, UA: NEGATIVE
Glucose, UA: NEGATIVE
Ketones UA: NEGATIVE
Leukocyte Esterase, UA: NEGATIVE
Nitrite, UA: POSITIVE — AB
Protein, UR: 30 — AB
Specific Gravity UA: 1.023 (ref 1.001–1.035)
Urine pH: 6 (ref 5.0–8.0)
Urobilinogen, UA: NORMAL mg/dL

## 2017-08-31 LAB — POCT PREGNANCY TEST, URINE HCG: POCT Pregnancy HCG Test, UR: NEGATIVE

## 2017-08-31 LAB — BASIC METABOLIC PANEL
BUN: 9 mg/dL (ref 7.0–19.0)
CO2: 22 mEq/L (ref 22–29)
Calcium: 9.2 mg/dL (ref 8.5–10.5)
Chloride: 108 mEq/L (ref 100–111)
Creatinine: 0.8 mg/dL (ref 0.6–1.0)
Glucose: 88 mg/dL (ref 70–100)
Potassium: 4.2 mEq/L (ref 3.5–5.1)
Sodium: 138 mEq/L (ref 136–145)

## 2017-08-31 LAB — GFR: EGFR: >60

## 2017-08-31 NOTE — ED Provider Notes (Signed)
3:03 PM  I have briefly evaluated this patient as triage physician in order to facilitate and initiate the ordering of laboratory and imaging studies as needed.    Hx of PCOS  Now w/ RLQ pain, concerned about STI and wants to be checked.   Hx of "cancer cells" has not followed up.   Denies vaginal d/c.        Rudi Rummage, MD  08/31/17 [phone]

## 2017-08-31 NOTE — ED Provider Notes (Signed)
[facility] EMERGENCY DEPARTMENT H&P      Visit date: 08/31/2017      CLINICAL SUMMARY          Diagnosis:    .     Final diagnoses:   None         MDM Notes:      Pelvic pain - consider cyst, less likely PID as no fever, discharge. May be muscular related to intercourse. Rule out pregnancy.  Check labs.  Will test for STD as well as patient would like this done.      Disposition:         Discharge         Discharge Prescriptions     None                     CLINICAL INFORMATION        HPI:      Chief Complaint: Pelvic Pain  .    Cassandra Schneider is a 32 y.o. female who presents with right sided pelvic pain x 3 days. Pain is worse with intercourse, and again had worse pain today with intercourse.  No vaginal discharge or abnormal bleeding. - No fever, vomiting. Hx PCOS but no Rx - has had cysts in past.  Also concerned for possible STD as boyfriend not always faithful in past.    History obtained from: Patient          ROS:      Positive and negative ROS elements as per HPI.  All other systems reviewed and negative.      Physical Exam:      Pulse (!) 104  BP 143/85  Resp 19  SpO2 97 %  Temp 97.9 F (36.6 C)    Physical Exam   Constitutional: She is oriented to person, place, and time. She appears well-developed and well-nourished.   WD female - alert, mod. Obese, NAD     HENT:   Head: Normocephalic and atraumatic.   Neck: Normal range of motion. Neck supple. No JVD present. No tracheal deviation present. No thyromegaly present.   Cardiovascular: Normal rate, regular rhythm, normal heart sounds and intact distal pulses.  Exam reveals no gallop and no friction rub.    No murmur heard.  Pulmonary/Chest: Effort normal and breath sounds normal. No stridor. No respiratory distress. She has no wheezes. She has no rales. She exhibits no tenderness.   Abdominal: Soft. Bowel sounds are normal. She exhibits no distension and no mass. There is tenderness. There is no rebound and no guarding.   RLQ tenderness below McBurney's  point.  NO guarding, rebound.   Genitourinary: Vagina normal and uterus normal.   Genitourinary Comments: Minimal whitish vaginal discharge which appears  Physiologic.  NO abnormal discharge, bleeding.  Uterus small, non-tender.  NO CMT or adnexal tenderness.   Musculoskeletal: Normal range of motion. She exhibits no edema or tenderness.   Lymphadenopathy:     She has no cervical adenopathy.   Neurological: She is alert and oriented to person, place, and time. No cranial nerve deficit. She exhibits normal muscle tone. Coordination normal.   Skin: Skin is warm. No rash noted. No erythema. No pallor.   Psychiatric: She has a normal mood and affect. Her behavior is normal. Judgment and thought content normal.   Nursing note and vitals reviewed.                   PAST HISTORY  Primary Care Provider: No primary care provider on file.        PMH/PSH:    .     No past medical history on file.    She has no past surgical history on file.      Social/Family History:      She has no tobacco, alcohol, and drug history on file.    No family history on file.      Listed Medications on Arrival:    .     Home Medications     None on File         Allergies: She has no allergies on file.            VISIT INFORMATION        Clinical Course in the ED:       Sonogram unremarkable Labs also unremarkable.  STable for discharge.  Will refer to GYN clinic as needed.      Medications Given in the ED:    .     ED Medication Orders     None            Procedures:      Procedures      Interpretations:      O2 sat-           saturation: 97 %; Oxygen use: room air; Interpretation: Normal                   RESULTS        Lab Results:      Results     ** No results found for the last 24 hours. **              Radiology Results:      US Pelvic with Transvaginal (r/o torsion)    (Results Pending)               Scribe Attestation:      I was acting as a Neurosurgeon for Maurine Minister, MD on [facility]  Treatment Team: Scribe: Karoline Caldwell      I am the first provider for this patient and I personally performed the services documented. Treatment Team: Scribe: Karoline Caldwell is scribing for me on [facility]. This note and the patient instructions accurately reflect work and decisions made by me.  Maurine Minister, MD            Maurine Minister, MD  09/01/17 [phone]

## 2017-08-31 NOTE — Discharge Instructions (Signed)
Pelvic Pain    You have been diagnosed with pelvic pain.    Pelvic pain affects many women who come here for evaluation. Your doctor has checked for the most dangerous causes of pelvic pain like appendicitis, pelvic abscess ectopic (tubal) pregnancy or other pregnancy complications. You do not have any of these problems.    Pelvic pain has many other causes that cannot be found by the type of testing that can be done here today. Some diseases may cause abnormal vaginal bleeding with pain. This includes uterine fibroids and endometriosis. For these problems, doctors often need to look at the pelvic organs. This is done in the operating room under general anesthesia (drugs to make you unconscious).    The doctor may suspect a specific cause of your pelvic pain after examining you today and doing some tests. Such a cause could be infection of the uterus or fallopian tubes (called "pelvic inflammatory disease" or "PID"). If so, treatment can start right away. It doesn t matter that it might take a few days to get all test results back.    Cancer rarely causes serious pelvic pain. However, it still needs to be considered as a possible cause. The emergency department or urgent care clinic often cannot diagnose or rule out cancer as the cause of pelvic pain. Cancer screening is not part of a routine emergency evaluation.    It is VERY IMPORTANT to follow up with your regular doctor. This will be to evaluate all possible causes of the pain.    It is OK to go home now.    You may need to return here or go to the nearest Emergency Department if symptoms that might signal complications develop. These include: Worsening infection, vaginal bleeding or some other problem.    Follow up with your gynecologist or regular doctor in the next few days. Tell your doctor about this visit.   If you do not have a regular doctor, tell the medical staff before leaving. That way, we can help make arrangements for you.    YOU SHOULD  SEEK MEDICAL ATTENTION IMMEDIATELY, EITHER HERE OR AT THE NEAREST EMERGENCY DEPARTMENT, IF ANY OF THE FOLLOWING OCCURS:   You have worse pain in the abdomen (belly), pelvis or back.   More and more vaginal discharge or bleeding, soaking of pads/tampons (more than one pad per hour), passing large clots.   Fever (temperature higher than 100.4F / 38C), chills, nausea, vomiting (throwing up).   Feeling dizzy, lightheaded or passing out.

## 2017-09-03 LAB — GENITAL CHLAMYDIA/NEISSERIA BY PCR
Chlamydia DNA by PCR: NEGATIVE
Neisseria gonorrhoeae by PCR: NEGATIVE

## 2017-12-28 ENCOUNTER — Emergency Department (HOSPITAL_COMMUNITY): Payer: Self-pay | Admitting: Registered Nurse

## 2017-12-28 ENCOUNTER — Other Ambulatory Visit: Payer: Self-pay

## 2017-12-28 ENCOUNTER — Emergency Department (HOSPITAL_COMMUNITY): Payer: Self-pay

## 2017-12-28 ENCOUNTER — Emergency Department (HOSPITAL_COMMUNITY)
Admission: EM | Admit: 2017-12-28 | Discharge: 2017-12-28 | Disposition: A | Discharge status: Home / Self Care | Payer: Self-pay | Attending: Emergency Medicine | Admitting: Emergency Medicine

## 2017-12-28 ENCOUNTER — Encounter (HOSPITAL_COMMUNITY): Payer: Self-pay

## 2017-12-28 ENCOUNTER — Encounter (HOSPITAL_COMMUNITY)
Admission: EM | Disposition: A | Discharge status: Home / Self Care | Payer: Self-pay | Source: Home / Self Care | Attending: Emergency Medicine

## 2017-12-28 DIAGNOSIS — F17210 Nicotine dependence, cigarettes, uncomplicated: Secondary | ICD-10-CM | POA: Insufficient documentation

## 2017-12-28 DIAGNOSIS — J36 Peritonsillar abscess: Secondary | ICD-10-CM | POA: Insufficient documentation

## 2017-12-28 DIAGNOSIS — Z6838 Body mass index (BMI) 38.0-38.9, adult: Secondary | ICD-10-CM | POA: Insufficient documentation

## 2017-12-28 DIAGNOSIS — Z791 Long term (current) use of non-steroidal anti-inflammatories (NSAID): Secondary | ICD-10-CM | POA: Insufficient documentation

## 2017-12-28 HISTORY — PX: INCISION AND DRAINAGE ABSCESS: SHX5864

## 2017-12-28 LAB — I-STAT CHEM 8, ED
BUN: 4 mg/dL — ABNORMAL LOW (ref 6–20)
CHLORIDE: 106 mmol/L (ref 98–111)
Calcium, Ion: 1.11 mmol/L — ABNORMAL LOW (ref 1.15–1.40)
Creatinine, Ser: 0.5 mg/dL (ref 0.44–1.00)
GLUCOSE: 100 mg/dL — AB (ref 70–99)
HCT: 41 % (ref 36.0–46.0)
Hemoglobin: 13.9 g/dL (ref 12.0–15.0)
Potassium: 3.5 mmol/L (ref 3.5–5.1)
Sodium: 137 mmol/L (ref 135–145)
TCO2: 23 mmol/L (ref 22–32)

## 2017-12-28 LAB — CBC WITH DIFFERENTIAL/PLATELET
Abs Immature Granulocytes: 0.06 10*3/uL (ref 0.00–0.07)
Basophils Absolute: 0 10*3/uL (ref 0.0–0.1)
Basophils Relative: 0 %
Eosinophils Absolute: 0 10*3/uL (ref 0.0–0.5)
Eosinophils Relative: 0 %
HCT: 43.2 % (ref 36.0–46.0)
Hemoglobin: 14.1 g/dL (ref 12.0–15.0)
Immature Granulocytes: 0 %
Lymphocytes Relative: 13 %
Lymphs Abs: 2 10*3/uL (ref 0.7–4.0)
MCH: 28.6 pg (ref 26.0–34.0)
MCHC: 32.6 g/dL (ref 30.0–36.0)
MCV: 87.6 fL (ref 80.0–100.0)
MONOS PCT: 7 %
Monocytes Absolute: 1.1 10*3/uL — ABNORMAL HIGH (ref 0.1–1.0)
Neutro Abs: 12.5 10*3/uL — ABNORMAL HIGH (ref 1.7–7.7)
Neutrophils Relative %: 80 %
Platelets: 282 10*3/uL (ref 150–400)
RBC: 4.93 MIL/uL (ref 3.87–5.11)
RDW: 12.9 % (ref 11.5–15.5)
WBC: 15.7 10*3/uL — ABNORMAL HIGH (ref 4.0–10.5)
nRBC: 0 % (ref 0.0–0.2)

## 2017-12-28 LAB — I-STAT BETA HCG BLOOD, ED (MC, WL, AP ONLY): I-stat hCG, quantitative: <5 m[IU]/mL (ref <5)

## 2017-12-28 LAB — GROUP A STREP BY PCR: GROUP A STREP BY PCR: NOT DETECTED

## 2017-12-28 SURGERY — INCISION AND DRAINAGE, ABSCESS
Anesthesia: General | Laterality: Left

## 2017-12-28 MED ORDER — SODIUM CHLORIDE 0.9 % IV BOLUS
1000.0000 mL | Freq: Once | INTRAVENOUS | Status: AC
  Administered 2017-12-28: 1000 mL via INTRAVENOUS

## 2017-12-28 MED ORDER — LACTATED RINGERS IV SOLN
INTRAVENOUS | Status: DC
  Administered 2017-12-28: 19:00:00 via INTRAVENOUS

## 2017-12-28 MED ORDER — OXYCODONE HCL 5 MG PO TABS: 5.0000 mg | ORAL_TABLET | Freq: Once | ORAL | Status: DC | PRN

## 2017-12-28 MED ORDER — FENTANYL CITRATE (PF) 100 MCG/2ML IJ SOLN
INTRAMUSCULAR | Status: DC | PRN
  Administered 2017-12-28: 50 ug via INTRAVENOUS

## 2017-12-28 MED ORDER — FENTANYL CITRATE (PF) 100 MCG/2ML IJ SOLN: 25.0000 ug | INTRAMUSCULAR | Status: DC | PRN

## 2017-12-28 MED ORDER — REMIFENTANIL HCL 1 MG IV SOLR
INTRAVENOUS | Status: AC
  Filled 2017-12-28: qty 1000

## 2017-12-28 MED ORDER — FENTANYL CITRATE (PF) 250 MCG/5ML IJ SOLN
INTRAMUSCULAR | Status: AC
  Filled 2017-12-28: qty 5

## 2017-12-28 MED ORDER — FENTANYL CITRATE (PF) 100 MCG/2ML IJ SOLN: 50.0000 ug | Freq: Once | INTRAMUSCULAR | Status: DC

## 2017-12-28 MED ORDER — HYDROCODONE-ACETAMINOPHEN 7.5-325 MG/15ML PO SOLN: 10.0000 mL | ORAL | Status: DC | PRN

## 2017-12-28 MED ORDER — DEXAMETHASONE SODIUM PHOSPHATE 10 MG/ML IJ SOLN
INTRAMUSCULAR | Status: AC
  Filled 2017-12-28: qty 1

## 2017-12-28 MED ORDER — AMOXICILLIN 250 MG/5ML PO SUSR: ORAL | 0 refills | Status: AC

## 2017-12-28 MED ORDER — PROPOFOL 10 MG/ML IV BOLUS
INTRAVENOUS | Status: AC
  Filled 2017-12-28: qty 40

## 2017-12-28 MED ORDER — HYDROCODONE-ACETAMINOPHEN 7.5-325 MG/15ML PO SOLN: 10.0000 mL | ORAL | 0 refills | Status: AC | PRN

## 2017-12-28 MED ORDER — MEPERIDINE HCL 50 MG/ML IJ SOLN: 6.2500 mg | INTRAMUSCULAR | Status: DC | PRN

## 2017-12-28 MED ORDER — LIDOCAINE-EPINEPHRINE 1 %-1:100000 IJ SOLN
20.0000 mL | Freq: Once | INTRAMUSCULAR | Status: DC
  Filled 2017-12-28: qty 20

## 2017-12-28 MED ORDER — ONDANSETRON HCL 4 MG/2ML IJ SOLN
INTRAMUSCULAR | Status: AC
  Filled 2017-12-28: qty 2

## 2017-12-28 MED ORDER — LACTATED RINGERS IV SOLN: INTRAVENOUS | Status: DC | PRN

## 2017-12-28 MED ORDER — MIDAZOLAM HCL 2 MG/2ML IJ SOLN
INTRAMUSCULAR | Status: AC
  Filled 2017-12-28: qty 2

## 2017-12-28 MED ORDER — MIDAZOLAM HCL 5 MG/5ML IJ SOLN
INTRAMUSCULAR | Status: DC | PRN
  Administered 2017-12-28: 2 mg via INTRAVENOUS

## 2017-12-28 MED ORDER — ALBUTEROL SULFATE HFA 108 (90 BASE) MCG/ACT IN AERS
INHALATION_SPRAY | RESPIRATORY_TRACT | Status: AC
  Filled 2017-12-28: qty 6.7

## 2017-12-28 MED ORDER — SODIUM CHLORIDE (PF) 0.9 % IJ SOLN
INTRAMUSCULAR | Status: AC
  Filled 2017-12-28: qty 20

## 2017-12-28 MED ORDER — ACETAMINOPHEN 325 MG PO TABS: 325.0000 mg | ORAL_TABLET | ORAL | Status: DC | PRN

## 2017-12-28 MED ORDER — ONDANSETRON HCL 4 MG/2ML IJ SOLN: 4.0000 mg | Freq: Once | INTRAMUSCULAR | Status: DC | PRN

## 2017-12-28 MED ORDER — DEXAMETHASONE SODIUM PHOSPHATE 10 MG/ML IJ SOLN
10.0000 mg | Freq: Once | INTRAMUSCULAR | Status: AC
  Administered 2017-12-28: 10 mg via INTRAVENOUS
  Filled 2017-12-28: qty 1

## 2017-12-28 MED ORDER — AMOXICILLIN 250 MG/5ML PO SUSR: 250.0000 mg | Freq: Three times a day (TID) | ORAL | Status: DC

## 2017-12-28 MED ORDER — SODIUM CHLORIDE (PF) 0.9 % IJ SOLN
INTRAMUSCULAR | Status: AC
  Filled 2017-12-28: qty 50

## 2017-12-28 MED ORDER — IOPAMIDOL (ISOVUE-300) INJECTION 61%
100.0000 mL | Freq: Once | INTRAVENOUS | Status: AC | PRN
  Administered 2017-12-28: 75 mL via INTRAVENOUS

## 2017-12-28 MED ORDER — KETOROLAC TROMETHAMINE 30 MG/ML IJ SOLN
30.0000 mg | Freq: Once | INTRAMUSCULAR | Status: AC
  Administered 2017-12-28: 30 mg via INTRAVENOUS
  Filled 2017-12-28: qty 1

## 2017-12-28 MED ORDER — ONDANSETRON HCL 4 MG/2ML IJ SOLN
INTRAMUSCULAR | Status: DC | PRN
  Administered 2017-12-28: 4 mg via INTRAVENOUS

## 2017-12-28 MED ORDER — LIDOCAINE 2% (20 MG/ML) 5 ML SYRINGE
INTRAMUSCULAR | Status: AC
  Filled 2017-12-28: qty 5

## 2017-12-28 MED ORDER — LIDOCAINE 2% (20 MG/ML) 5 ML SYRINGE
INTRAMUSCULAR | Status: DC | PRN
  Administered 2017-12-28: 40 mg via INTRAVENOUS

## 2017-12-28 MED ORDER — OXYCODONE HCL 5 MG/5ML PO SOLN
5.0000 mg | Freq: Once | ORAL | Status: DC | PRN
  Filled 2017-12-28: qty 5

## 2017-12-28 MED ORDER — CLINDAMYCIN PHOSPHATE 600 MG/50ML IV SOLN
600.0000 mg | Freq: Once | INTRAVENOUS | Status: AC
  Administered 2017-12-28: 600 mg via INTRAVENOUS
  Filled 2017-12-28: qty 50

## 2017-12-28 MED ORDER — PROPOFOL 10 MG/ML IV BOLUS
INTRAVENOUS | Status: DC | PRN
  Administered 2017-12-28: 200 mg via INTRAVENOUS

## 2017-12-28 MED ORDER — SUCCINYLCHOLINE CHLORIDE 200 MG/10ML IV SOSY
PREFILLED_SYRINGE | INTRAVENOUS | Status: DC | PRN
  Administered 2017-12-28: 200 mg via INTRAVENOUS

## 2017-12-28 MED ORDER — DEXAMETHASONE SODIUM PHOSPHATE 10 MG/ML IJ SOLN
INTRAMUSCULAR | Status: DC | PRN
  Administered 2017-12-28: 10 mg via INTRAVENOUS

## 2017-12-28 MED ORDER — ACETAMINOPHEN 160 MG/5ML PO SOLN: 325.0000 mg | ORAL | Status: DC | PRN

## 2017-12-28 MED ORDER — ONDANSETRON HCL 4 MG/2ML IJ SOLN: 4.0000 mg | Freq: Once | INTRAMUSCULAR | Status: DC

## 2017-12-28 MED ORDER — SUCCINYLCHOLINE CHLORIDE 200 MG/10ML IV SOSY
PREFILLED_SYRINGE | INTRAVENOUS | Status: AC
  Filled 2017-12-28: qty 10

## 2017-12-28 MED ORDER — REMIFENTANIL HCL 1 MG IV SOLR
INTRAVENOUS | Status: DC | PRN
  Administered 2017-12-28: .5 ug/kg/min via INTRAVENOUS

## 2017-12-28 SURGICAL SUPPLY — 14 items
CATH ROBINSON RED A/P 10FR (CATHETERS) ×3 IMPLANT
CLEANER TIP ELECTROSURG 2X2 (MISCELLANEOUS) ×3 IMPLANT
COVER SURGICAL LIGHT HANDLE (MISCELLANEOUS) ×3 IMPLANT
ELECT COATED BLADE 2.86 ST (ELECTRODE) ×3 IMPLANT
ELECT REM PT RETURN 15FT ADLT (MISCELLANEOUS) ×3 IMPLANT
GAUZE 4X4 16PLY RFD (DISPOSABLE) ×3 IMPLANT
GLOVE ECLIPSE 8.0 STRL XLNG CF (GLOVE) ×3 IMPLANT
KIT BASIN OR (CUSTOM PROCEDURE TRAY) ×3 IMPLANT
NEEDLE SPNL 22GX3.5 QUINCKE BK (NEEDLE) ×3 IMPLANT
PACK BASIC VI WITH GOWN DISP (CUSTOM PROCEDURE TRAY) ×3 IMPLANT
PENCIL FOOT CONTROL (ELECTRODE) ×3 IMPLANT
SPONGE TONSIL TAPE 1 RFD (DISPOSABLE) ×3 IMPLANT
SYR BULB 3OZ (MISCELLANEOUS) ×3 IMPLANT
YANKAUER SUCT BULB TIP 10FT TU (MISCELLANEOUS) ×3 IMPLANT

## 2017-12-28 NOTE — Discharge Instructions (Signed)
Drink plenty of fluids Advance diet and activity as comfortable Recheck my office 2 weeks.  [phone] for an appointment Amoxicillin antibiotics.   Hydrocodone for pain relief.  May alternate with Ibuprofen.   General Anesthesia, Adult, Care After These instructions provide you with information about caring for yourself after your procedure. Your health care provider may also give you more specific instructions. Your treatment has been planned according to current medical practices, but problems sometimes occur. Call your health care provider if you have any problems or questions after your procedure. What can I expect after the procedure? After the procedure, it is common to have:  Vomiting.  A sore throat.  Mental slowness.  It is common to feel:  Nauseous.  Cold or shivery.  Sleepy.  Tired.  Sore or achy, even in parts of your body where you did not have surgery.  Follow these instructions at home: For at least 24 hours after the procedure:  Do not: ? Participate in activities where you could fall or become injured. ? Drive. ? Use heavy machinery. ? Drink alcohol. ? Take sleeping pills or medicines that cause drowsiness. ? Make important decisions or sign legal documents. ? Take care of children on your own.  Rest. Eating and drinking  If you vomit, drink water, juice, or soup when you can drink without vomiting.  Drink enough fluid to keep your urine clear or pale yellow.  Make sure you have little or no nausea before eating solid foods.  Follow the diet recommended by your health care provider. General instructions  Have a responsible adult stay with you until you are awake and alert.  Return to your normal activities as told by your health care provider. Ask your health care provider what activities are safe for you.  Take over-the-counter and prescription medicines only as told by your health care provider.  If you smoke, do not smoke without  supervision.  Keep all follow-up visits as told by your health care provider. This is important. Contact a health care provider if:  You continue to have nausea or vomiting at home, and medicines are not helpful.  You cannot drink fluids or start eating again.  You cannot urinate after 8-12 hours.  You develop a skin rash.  You have fever.  You have increasing redness at the site of your procedure. Get help right away if:  You have difficulty breathing.  You have chest pain.  You have unexpected bleeding.  You feel that you are having a life-threatening or urgent problem. This information is not intended to replace advice given to you by your health care provider. Make sure you discuss any questions you have with your health care provider. Document Released: 04/24/2000 Document Revised: 06/21/2015 Document Reviewed: 12/31/2014 Elsevier Interactive Patient Education  [employer]2018 [employer]

## 2017-12-28 NOTE — Anesthesia Procedure Notes (Signed)
Date/Time: 12/28/2017 7:04 PM Performed by: Minerva EndsMirarchi, Lesslie Mckeehan M, CRNA Oxygen Delivery Method: Simple face mask Placement Confirmation: positive ETCO2 and breath sounds checked- equal and bilateral Dental Injury: Teeth and Oropharynx as per pre-operative assessment

## 2017-12-28 NOTE — Anesthesia Preprocedure Evaluation (Signed)
Anesthesia Evaluation  Patient identified by MRN, date of birth, ID band Patient awake    Reviewed: Allergy & Precautions, NPO status , Patient's Chart, lab work & pertinent test results  Airway Mallampati: II  TM Distance: >3 FB Neck ROM: Full  Mouth opening: Limited Mouth Opening  Dental  (+) Teeth Intact   Pulmonary neg pulmonary ROS, Current Smoker,    Pulmonary exam normal        Cardiovascular negative cardio ROS Normal cardiovascular exam Rhythm:Regular Rate:Normal     Neuro/Psych  Headaches, negative psych ROS   GI/Hepatic negative GI ROS, Neg liver ROS,   Endo/Other  Morbid obesity  Renal/GU negative Renal ROS  negative genitourinary   Musculoskeletal negative musculoskeletal ROS (+)   Abdominal Normal abdominal exam  (+) + obese,   Peds  Hematology   Anesthesia Other Findings   Reproductive/Obstetrics negative OB ROS                             Anesthesia Physical Anesthesia Plan  ASA: II and emergent  Anesthesia Plan: General   Post-op Pain Management:    Induction: Intravenous  PONV Risk Score and Plan: 2 and Ondansetron and Treatment may vary due to age or medical condition  Airway Management Planned: Oral ETT  Additional Equipment:   Intra-op Plan:   Post-operative Plan: Extubation in OR  Informed Consent:   Plan Discussed with: CRNA, Anesthesiologist and Surgeon  Anesthesia Plan Comments:         Anesthesia Quick Evaluation

## 2017-12-28 NOTE — Anesthesia Procedure Notes (Signed)
Procedure Name: Intubation Date/Time: 12/28/2017 6:43 PM Performed by: Minerva EndsMirarchi, Marye Eagen M, CRNA Pre-anesthesia Checklist: Patient identified, Emergency Drugs available, Suction available and Patient being monitored Patient Re-evaluated:Patient Re-evaluated prior to induction Oxygen Delivery Method: Circle System Utilized Preoxygenation: Pre-oxygenation with 100% oxygen Induction Type: IV induction Ventilation: Mask ventilation without difficulty Laryngoscope Size: Miller and 2 Tube type: Oral Number of attempts: 1 Airway Equipment and Method: Stylet Placement Confirmation: ETT inserted through vocal cords under direct vision,  positive ETCO2 and breath sounds checked- equal and bilateral Secured at: 22 cm Tube secured with: Tape Dental Injury: Teeth and Oropharynx as per pre-operative assessment  Comments: Smooth IV induction Oddono-- intubation AM CRNA atraumatic-- teeth and mouth as preop-- chipped front teeth present and unchanged with ETT insertion-- bilat BS Oddono-- wheezing present-- Albuterol given with inprovement

## 2017-12-28 NOTE — ED Triage Notes (Signed)
Patient c/o sore throat x 3 days.  

## 2017-12-28 NOTE — Consult Note (Signed)
Diane Fletcher,  Diane Fletcher 32 y.o., female [id number]     Chief Complaint: LEFT sore throat  HPI: 32 yo wf, 3 day hx progressive sore throat.  Lateralized to LEFT.  Breathing OK.  Swallowing with pain and difficulty.  Several sore throats this year including strep throat and tonsillitis.  Prior RIGHT peritonsillar abscess 2010.  No treatment thus far. CT in ED shows LEFT multiloculated PTA.  WBC 15K.  rec'd Clinda, Toradol, Decadron in ER with some relief.    PMH: Past Medical History:  Diagnosis Date  . Migraines   . Ovarian cyst     Surg MV:HQIONGEHx:History reviewed. No pertinent surgical history.  FHx:   Family History  Problem Relation Age of Onset  . Diabetes Other    SocHx:  reports that she has been smoking cigarettes. She has been smoking about 1.00 pack per day. She has never used smokeless tobacco. She reports that she has current or past drug history. Drug: Marijuana. She reports that she does not drink alcohol.  ALLERGIES:  Allergies  Allergen Reactions  . Pollen Extract      (Not in a hospital admission)  Results for orders placed or performed during the hospital encounter of 12/28/17 (from the past 48 hour(s))  Group A Strep by PCR     Status: None   Collection Time: 12/28/17 12:05 PM  Result Value Ref Range   Group A Strep by PCR NOT DETECTED NOT DETECTED    Comment: Performed at [facility], [address] [place], [place] [address]  CBC with Differential     Status: Abnormal   Collection Time: 12/28/17 12:51 PM  Result Value Ref Range   WBC 15.7 (H) 4.0 - 10.5 K/uL   RBC 4.93 3.87 - 5.11 MIL/uL   Hemoglobin 14.1 12.0 - 15.0 g/dL   HCT 13.243.2 44.036.0 - 10.246.0 %   MCV 87.6 80.0 - 100.0 fL   MCH 28.6 26.0 - 34.0 pg   MCHC 32.6 30.0 - 36.0 g/dL   RDW 72.512.9 36.611.5 - 44.015.5 %   Platelets 282 150 - 400 K/uL   nRBC 0.0 0.0 - 0.2 %   Neutrophils Relative % 80 %   Neutro Abs 12.5 (H) 1.7 - 7.7 K/uL   Lymphocytes Relative 13 %   Lymphs Abs 2.0 0.7 - 4.0 K/uL    Monocytes Relative 7 %   Monocytes Absolute 1.1 (H) 0.1 - 1.0 K/uL   Eosinophils Relative 0 %   Eosinophils Absolute 0.0 0.0 - 0.5 K/uL   Basophils Relative 0 %   Basophils Absolute 0.0 0.0 - 0.1 K/uL   Immature Granulocytes 0 %   Abs Immature Granulocytes 0.06 0.00 - 0.07 K/uL    Comment: Performed at [facility], [address] [place], [place] [address]  I-Stat beta hCG blood, ED     Status: None   Collection Time: 12/28/17 12:59 PM  Result Value Ref Range   I-stat hCG, quantitative <5.0 <5 mIU/mL   Comment 3            Comment:   GEST. AGE      CONC.  (mIU/mL)   <=1 WEEK        5 - 50     2 WEEKS       50 - 500     3 WEEKS       100 - 10,000     4 WEEKS     1,000 - 30,000  FEMALE AND NON-PREGNANT FEMALE:     LESS THAN 5 mIU/mL   I-stat Chem 8, ED     Status: Abnormal   Collection Time: 12/28/17  1:02 PM  Result Value Ref Range   Sodium 137 135 - 145 mmol/L   Potassium 3.5 3.5 - 5.1 mmol/L   Chloride 106 98 - 111 mmol/L   BUN 4 (L) 6 - 20 mg/dL   Creatinine, Ser 1.61 0.44 - 1.00 mg/dL   Glucose, Bld 096 (H) 70 - 99 mg/dL   Calcium, Ion 0.45 (L) 1.15 - 1.40 mmol/L   TCO2 23 22 - 32 mmol/L   Hemoglobin 13.9 12.0 - 15.0 g/dL   HCT 40.9 81.1 - 91.4 %   Ct Soft Tissue Neck W Contrast  Result Date: 12/28/2017 CLINICAL DATA:  Sore throat stridor. Suspect epiglottitis or tonsillitis. Leukocytosis EXAM: CT NECK WITH CONTRAST TECHNIQUE: Multidetector CT imaging of the neck was performed using the standard protocol following the bolus administration of intravenous contrast. CONTRAST:  75mL ISOVUE-300 IOPAMIDOL (ISOVUE-300) INJECTION 61% COMPARISON:  CT neck 08/01/2008 FINDINGS: Pharynx and larynx: Soft tissue swelling of the left pharyngeal tonsil. Central hypoenhancing fluid collection measures approximately 13 x 21 mm compatible with developing abscess. This has rim enhancement and is not fully liquified at this time. There is extensive surrounding soft  tissue edema extending down to the piriform sinus. Prior CT revealed similar but more extensive changes of inflammation in the left tonsil. Retropharyngeal effusion. Normal epiglottis Salivary glands: No inflammation, mass, or stone. Thyroid: Negative Lymph nodes: No enlarged lymph nodes. Right level 2 lymph node 11 mm. Left level 2 lymph node 11 mm. Small posterior lymph nodes bilaterally. Vascular: Negative Limited intracranial: Negative Visualized orbits: Mucous retention cyst in the sphenoid sinus. Mastoids and visualized paranasal sinuses: Remaining sinuses clear Skeleton: Cervical kyphosis which may be due to tonsillitis versus positioning. Upper chest: Negative Other: None IMPRESSION: Left tonsillitis with extensive edema in the soft tissues with partially developed peritonsillar abscess on the left measuring 13 x 21 mm. Pharynx mildly displaced to the right without significant airway compromise. Electronically Signed   By: Marlan Palau M.D.   On: 12/28/2017 14:43      Blood pressure (!) 150/101, pulse 98, temperature 98.5 F (36.9 C), temperature source Oral, resp. rate 18, height 5\' 3"  (1.6 m), weight 99.8 kg, last menstrual period 11/27/2017, SpO2 98 %.  PHYSICAL EXAM: Overall appearance:  Heavy set.  Some "hot potato" voice.  Fetor oris! Head: NCAT Ears: clear Nose:  Excoriated c/w smoking. Oral Cavity:  Teeth in good repair Oral Pharynx/Hypopharynx/Larynx  Asymmetric LEFT tonsil swelling with bulging LEFT hemi-soft palate.   Neuro: intact Neck:  Tender LEFT JDG region  Studies Reviewed: CT neck    Assessment/Plan   LEFT PTA, recurrent  Plan:  For I&D LEFT PTA.  Po Amoxicillin, liquid Hydrocodone  Flo Shanks 12/28/2017, 5:21 PM

## 2017-12-28 NOTE — ED Provider Notes (Signed)
[facility]-EMERGENCY DEPT Provider Note   CSN: [id number] Arrival date & time: 12/28/17  1110     History   Chief Complaint Chief Complaint  Patient presents with  . Sore Throat    HPI Diane Fletcher is a 32 y.o. female possible history of migraines who presents for evaluation of 3 days of sore throat that has progressively worsened.  She states yesterday, she could barely tolerate any liquids as it was more painful.  She has not been able to tolerate any solid foods for the last 2 days.  She states she is barely able to tolerate her secretions and reports yesterday, she was having to spit them up.  She states she took Advil last night for pain with no improvement.  Additionally, she took 2 pills of amoxicillin this morning prior to coming to the ED.  She states she has not had any fevers.  She denies any associated rhinorrhea, cough, nasal congestion.  She has not noticed any facial swelling but she does feel like the pain is going up through her throat to the right side of her face.  Patient denies any difficulty breathing, vomiting.  The history is provided by the patient.    Past Medical History:  Diagnosis Date  . Migraines   . Ovarian cyst     There are no active problems to display for this patient.   History reviewed. No pertinent surgical history.   OB History   None      Home Medications    Prior to Admission medications   Medication Sig Start Date End Date Taking? Authorizing Provider  ibuprofen (ADVIL,MOTRIN) 200 MG tablet Take 800 mg by mouth every 6 (six) hours as needed for headache or mild pain.   Yes [provider]  amoxicillin (AMOXIL) 250 MG/5ML suspension 20 ml po tid x 3 days, then 10 ml po tid x 7 days 12/28/17   Flo Shanks, MD  Guaifenesin 1200 MG TB12 Take 1 tablet (1,200 mg total) by mouth 2 (two) times daily. Patient not taking: Reported on 12/28/2017 11/09/16   Charlestine Night, PA-C    HYDROcodone-acetaminophen (HYCET) 7.5-325 mg/15 ml solution Take 10-15 mLs by mouth every 4 (four) hours as needed for moderate pain. 12/28/17   Flo Shanks, MD  HYDROcodone-acetaminophen (NORCO/VICODIN) 5-325 MG tablet Take 1-2 tablets by mouth every 6 (six) hours as needed for severe pain. Patient not taking: Reported on 12/28/2017 08/03/17   Anselm Pancoast, PA-C  ibuprofen (ADVIL,MOTRIN) 800 MG tablet Take 1 tablet (800 mg total) by mouth every 8 (eight) hours as needed. Patient not taking: Reported on 12/28/2017 11/09/16   Charlestine Night, PA-C  lidocaine (LIDODERM) 5 % Place 1 patch onto the skin daily. Remove & Discard patch within 12 hours or as directed by MD Patient not taking: Reported on 12/28/2017 08/03/17   Joy, Hillard Danker, PA-C  methocarbamol (ROBAXIN) 500 MG tablet Take 1 tablet (500 mg total) by mouth 2 (two) times daily. Patient not taking: Reported on 12/28/2017 08/03/17   Anselm Pancoast, PA-C  promethazine-dextromethorphan (PROMETHAZINE-DM) 6.25-15 MG/5ML syrup Take 5 mLs by mouth 4 (four) times daily as needed for cough. Patient not taking: Reported on 12/28/2017 11/09/16   Charlestine Night, PA-C  rizatriptan (MAXALT-MLT) 10 MG disintegrating tablet Take 1 tablet (10 mg total) by mouth as needed for migraine. May repeat in 2 hours if needed Patient not taking: Reported on 05/05/2016 04/18/16   Penumalli, Glenford Bayley, MD  topiramate (TOPAMAX) 50 MG  tablet Take 1 tablet (50 mg total) by mouth 2 (two) times daily. Patient not taking: Reported on 05/05/2016 04/18/16   Suanne Marker, MD    Family History Family History  Problem Relation Age of Onset  . Diabetes Other     Social History Social History   Tobacco Use  . Smoking status: Current Every Day Smoker    Packs/day: 1.00    Types: Cigarettes  . Smokeless tobacco: Never Used  Substance Use Topics  . Alcohol use: No  . Drug use: Yes    Types: Marijuana    Comment: 04/18/16 avg 3 daily     Allergies   Pollen  extract   Review of Systems Review of Systems  Constitutional: Negative for fever.  HENT: Positive for sore throat and trouble swallowing. Negative for drooling, ear pain and facial swelling.   Respiratory: Negative for shortness of breath.   Gastrointestinal: Negative for vomiting.  All other systems reviewed and are negative.    Physical Exam Updated Vital Signs BP 140/89 (BP Location: Right Arm)   Pulse (!) 102   Temp 97.7 F (36.5 C)   Resp 18   Ht 5\' 3"  (1.6 m)   Wt 99.8 kg   LMP 11/27/2017 (Approximate)   SpO2 96%   BMI 38.97 kg/m   Physical Exam  Constitutional: She appears well-developed and well-nourished.  HENT:  Head: Normocephalic and atraumatic.  Mouth/Throat: There is trismus in the jaw. Posterior oropharyngeal edema and posterior oropharyngeal erythema present.  Voice is slightly muffled.  Airway is patent.  She does exhibit some slight trismus and is only able to open her mouth about 2 fingers width.  On exam, she has asymmetric swelling of the left peritonsillar area with uvular deviation towards the right.  Eyes: Conjunctivae and EOM are normal. Right eye exhibits no discharge. Left eye exhibits no discharge. No scleral icterus.  Pulmonary/Chest: Effort normal.  Neurological: She is alert.  Skin: Skin is warm and dry.  Psychiatric: She has a normal mood and affect. Her speech is normal and behavior is normal.  Nursing note and vitals reviewed.    ED Treatments / Results  Labs (all labs ordered are listed, but only abnormal results are displayed) Labs Reviewed  CBC WITH DIFFERENTIAL/PLATELET - Abnormal; Notable for the following components:      Result Value   WBC 15.7 (*)    Neutro Abs 12.5 (*)    Monocytes Absolute 1.1 (*)    All other components within normal limits  I-STAT CHEM 8, ED - Abnormal; Notable for the following components:   BUN 4 (*)    Glucose, Bld 100 (*)    Calcium, Ion 1.11 (*)    All other components within normal limits   GROUP A STREP BY PCR  I-STAT BETA HCG BLOOD, ED ([facility], [facility], [facility] ONLY)    EKG None  Radiology Ct Soft Tissue Neck W Contrast  Result Date: 12/28/2017 CLINICAL DATA:  Sore throat stridor. Suspect epiglottitis or tonsillitis. Leukocytosis EXAM: CT NECK WITH CONTRAST TECHNIQUE: Multidetector CT imaging of the neck was performed using the standard protocol following the bolus administration of intravenous contrast. CONTRAST:  75mL ISOVUE-300 IOPAMIDOL (ISOVUE-300) INJECTION 61% COMPARISON:  CT neck 08/01/2008 FINDINGS: Pharynx and larynx: Soft tissue swelling of the left pharyngeal tonsil. Central hypoenhancing fluid collection measures approximately 13 x 21 mm compatible with developing abscess. This has rim enhancement and is not fully liquified at this time. There is extensive surrounding soft tissue edema extending  down to the piriform sinus. Prior CT revealed similar but more extensive changes of inflammation in the left tonsil. Retropharyngeal effusion. Normal epiglottis Salivary glands: No inflammation, mass, or stone. Thyroid: Negative Lymph nodes: No enlarged lymph nodes. Right level 2 lymph node 11 mm. Left level 2 lymph node 11 mm. Small posterior lymph nodes bilaterally. Vascular: Negative Limited intracranial: Negative Visualized orbits: Mucous retention cyst in the sphenoid sinus. Mastoids and visualized paranasal sinuses: Remaining sinuses clear Skeleton: Cervical kyphosis which may be due to tonsillitis versus positioning. Upper chest: Negative Other: None IMPRESSION: Left tonsillitis with extensive edema in the soft tissues with partially developed peritonsillar abscess on the left measuring 13 x 21 mm. Pharynx mildly displaced to the right without significant airway compromise. Electronically Signed   By: Marlan Palauharles  Clark M.D.   On: 12/28/2017 14:43    Procedures Procedures (including critical care time)  Medications Ordered in ED Medications  sodium chloride (PF) 0.9 % injection (has no  administration in time range)  lidocaine-EPINEPHrine (XYLOCAINE W/EPI) 1 %-1:100000 (with pres) injection 20 mL ( Intradermal MAR Hold 12/28/17 1835)  ondansetron (ZOFRAN) injection 4 mg ( Intravenous MAR Hold 12/28/17 1835)  fentaNYL (SUBLIMAZE) injection 50 mcg ( Intravenous MAR Hold 12/28/17 1835)  lactated ringers infusion ( Intravenous Anesthesia Volume Adjustment 12/28/17 1907)  acetaminophen (TYLENOL) tablet 325-650 mg (has no administration in time range)    Or  acetaminophen (TYLENOL) solution 325-650 mg (has no administration in time range)  oxyCODONE (Oxy IR/ROXICODONE) immediate release tablet 5 mg (has no administration in time range)    Or  oxyCODONE (ROXICODONE) 5 MG/5ML solution 5 mg (has no administration in time range)  fentaNYL (SUBLIMAZE) injection 25-50 mcg (has no administration in time range)  meperidine (DEMEROL) injection 6.25-12.5 mg (has no administration in time range)  ondansetron (ZOFRAN) injection 4 mg (has no administration in time range)  amoxicillin (AMOXIL) 250 MG/5ML suspension 250 mg (has no administration in time range)  HYDROcodone-acetaminophen (HYCET) 7.5-325 mg/15 ml solution 10-15 mL (has no administration in time range)  sodium chloride 0.9 % bolus 1,000 mL (0 mLs Intravenous Stopped 12/28/17 1530)  ketorolac (TORADOL) 30 MG/ML injection 30 mg (30 mg Intravenous Given 12/28/17 1338)  dexamethasone (DECADRON) injection 10 mg (10 mg Intravenous Given 12/28/17 1335)  clindamycin (CLEOCIN) IVPB 600 mg (0 mg Intravenous Stopped 12/28/17 1408)  iopamidol (ISOVUE-300) 61 % injection 100 mL (75 mLs Intravenous Contrast Given 12/28/17 1420)     Initial Impression / Assessment and Plan / ED Course  I have reviewed the triage vital signs and the nursing notes.  Pertinent labs & imaging results that were available during my care of the patient were reviewed by me and considered in my medical decision making (see chart for details).     32 year old female  who presents for evaluation of sore throat x3 days.  Associated with difficulty swallowing.  No fevers, vomiting. Patient is afebrile, non-toxic appearing, sitting comfortably on examination table. Vital signs reviewed and stable.  She is slightly tachycardic.  Likely secondary to pain.  On exam, she has asymmetric swelling of the peritonsillar area with uvular deviation noted to the right.  Concern for peritonsillar abscess versus pharyngitis.  Rapid strep ordered at triage.  Will plan for labs, CT soft tissue neck for evaluation of peritonsillar abscess.  Patient started on fluids, clindamycin, Decadron, Toradol for pain relief.  I-stat beta negative.  I-STAT Chem-8 shows normal BUN and creatinine.  CBC with leukocytosis of 15.7.  On CT, she has  left tonsillitis with extensive edema in the soft tissues with a partially developed peritonsillar abscess on the left measuring 13 x 21 mm.  Pharynx is mildly displaced to the right without significant airway compromise.  Discussed patient with Dr. Lazarus Salines (ENT).  He will come evaluate in the ED for possible incision and drainage.  Discussed patient with Dr. Lazarus Salines after evaluation in the ED. He will take the patient to the OR for drainage. She will be discharged from the OR.    Final Clinical Impressions(s) / ED Diagnoses   Final diagnoses:  Peritonsillar abscess    ED Discharge Orders         Ordered    HYDROcodone-acetaminophen (HYCET) 7.5-325 mg/15 ml solution  Every 4 hours PRN     12/28/17 1906    amoxicillin (AMOXIL) 250 MG/5ML suspension     12/28/17 1906           Rosana Hoes 12/28/17 2119    Bethann Berkshire, MD 12/29/17 [phone]

## 2017-12-28 NOTE — ED Notes (Signed)
ED TO INPATIENT HANDOFF REPORT  Name/Age/Gender Diane Fletcher 32 y.o. female  Code Status   Home/SNF/Other Home  Chief Complaint sore throat  Level of Care/Admitting Diagnosis ED Disposition    None      Medical History Past Medical History:  Diagnosis Date  . Migraines   . Ovarian cyst     Allergies Allergies  Allergen Reactions  . Pollen Extract     IV Location/Drains/Wounds Patient Lines/Drains/Airways Status   Active Line/Drains/Airways    Name:   Placement date:   Placement time:   Site:   Days:   Peripheral IV 12/28/17 Left Antecubital   12/28/17    1252    Antecubital   less than 1          Labs/Imaging Results for orders placed or performed during the hospital encounter of 12/28/17 (from the past 48 hour(s))  Group A Strep by PCR     Status: None   Collection Time: 12/28/17 12:05 PM  Result Value Ref Range   Group A Strep by PCR NOT DETECTED NOT DETECTED    Comment: Performed at [facility], [address] [place], [place] [address]  CBC with Differential     Status: Abnormal   Collection Time: 12/28/17 12:51 PM  Result Value Ref Range   WBC 15.7 (H) 4.0 - 10.5 K/uL   RBC 4.93 3.87 - 5.11 MIL/uL   Hemoglobin 14.1 12.0 - 15.0 g/dL   HCT 60.4 54.0 - 98.1 %   MCV 87.6 80.0 - 100.0 fL   MCH 28.6 26.0 - 34.0 pg   MCHC 32.6 30.0 - 36.0 g/dL   RDW 19.1 47.8 - 29.5 %   Platelets 282 150 - 400 K/uL   nRBC 0.0 0.0 - 0.2 %   Neutrophils Relative % 80 %   Neutro Abs 12.5 (H) 1.7 - 7.7 K/uL   Lymphocytes Relative 13 %   Lymphs Abs 2.0 0.7 - 4.0 K/uL   Monocytes Relative 7 %   Monocytes Absolute 1.1 (H) 0.1 - 1.0 K/uL   Eosinophils Relative 0 %   Eosinophils Absolute 0.0 0.0 - 0.5 K/uL   Basophils Relative 0 %   Basophils Absolute 0.0 0.0 - 0.1 K/uL   Immature Granulocytes 0 %   Abs Immature Granulocytes 0.06 0.00 - 0.07 K/uL    Comment: Performed at [facility], [address], [place], [place]  [address]  I-Stat beta hCG blood, ED     Status: None   Collection Time: 12/28/17 12:59 PM  Result Value Ref Range   I-stat hCG, quantitative <5.0 <5 mIU/mL   Comment 3            Comment:   GEST. AGE      CONC.  (mIU/mL)   <=1 WEEK        5 - 50     2 WEEKS       50 - 500     3 WEEKS       100 - 10,000     4 WEEKS     1,000 - 30,000        FEMALE AND NON-PREGNANT FEMALE:     LESS THAN 5 mIU/mL   I-stat Chem 8, ED     Status: Abnormal   Collection Time: 12/28/17  1:02 PM  Result Value Ref Range   Sodium 137 135 - 145 mmol/L   Potassium 3.5 3.5 - 5.1 mmol/L   Chloride 106 98 -  111 mmol/L   BUN 4 (L) 6 - 20 mg/dL   Creatinine, Ser 7.820.50 0.44 - 1.00 mg/dL   Glucose, Bld 956100 (H) 70 - 99 mg/dL   Calcium, Ion 2.131.11 (L) 1.15 - 1.40 mmol/L   TCO2 23 22 - 32 mmol/L   Hemoglobin 13.9 12.0 - 15.0 g/dL   HCT 08.641.0 57.836.0 - 46.946.0 %   Ct Soft Tissue Neck W Contrast  Result Date: 12/28/2017 CLINICAL DATA:  Sore throat stridor. Suspect epiglottitis or tonsillitis. Leukocytosis EXAM: CT NECK WITH CONTRAST TECHNIQUE: Multidetector CT imaging of the neck was performed using the standard protocol following the bolus administration of intravenous contrast. CONTRAST:  75mL ISOVUE-300 IOPAMIDOL (ISOVUE-300) INJECTION 61% COMPARISON:  CT neck 08/01/2008 FINDINGS: Pharynx and larynx: Soft tissue swelling of the left pharyngeal tonsil. Central hypoenhancing fluid collection measures approximately 13 x 21 mm compatible with developing abscess. This has rim enhancement and is not fully liquified at this time. There is extensive surrounding soft tissue edema extending down to the piriform sinus. Prior CT revealed similar but more extensive changes of inflammation in the left tonsil. Retropharyngeal effusion. Normal epiglottis Salivary glands: No inflammation, mass, or stone. Thyroid: Negative Lymph nodes: No enlarged lymph nodes. Right level 2 lymph node 11 mm. Left level 2 lymph node 11 mm. Small posterior lymph nodes  bilaterally. Vascular: Negative Limited intracranial: Negative Visualized orbits: Mucous retention cyst in the sphenoid sinus. Mastoids and visualized paranasal sinuses: Remaining sinuses clear Skeleton: Cervical kyphosis which may be due to tonsillitis versus positioning. Upper chest: Negative Other: None IMPRESSION: Left tonsillitis with extensive edema in the soft tissues with partially developed peritonsillar abscess on the left measuring 13 x 21 mm. Pharynx mildly displaced to the right without significant airway compromise. Electronically Signed   By: Marlan Palauharles  Clark M.D.   On: 12/28/2017 14:43   None  Pending Labs Unresulted Labs (From admission, onward)   None      Vitals/Pain Today's Vitals   12/28/17 1140 12/28/17 1453 12/28/17 1456 12/28/17 1806  BP:  (!) 150/101  139/86  Pulse:  98  (!) 102  Resp:  18  18  Temp:      TempSrc:      SpO2:  98%  98%  Weight: 99.8 kg     Height: 5\' 3"  (1.6 m)     PainSc:  5  5      Isolation Precautions No active isolations  Medications Medications  sodium chloride (PF) 0.9 % injection (has no administration in time range)  lidocaine-EPINEPHrine (XYLOCAINE W/EPI) 1 %-1:100000 (with pres) injection 20 mL (has no administration in time range)  ondansetron (ZOFRAN) injection 4 mg (has no administration in time range)  fentaNYL (SUBLIMAZE) injection 50 mcg (has no administration in time range)  lactated ringers infusion (has no administration in time range)  sodium chloride 0.9 % bolus 1,000 mL (0 mLs Intravenous Stopped 12/28/17 1530)  ketorolac (TORADOL) 30 MG/ML injection 30 mg (30 mg Intravenous Given 12/28/17 1338)  dexamethasone (DECADRON) injection 10 mg (10 mg Intravenous Given 12/28/17 1335)  clindamycin (CLEOCIN) IVPB 600 mg (0 mg Intravenous Stopped 12/28/17 1408)  iopamidol (ISOVUE-300) 61 % injection 100 mL (75 mLs Intravenous Contrast Given 12/28/17 1420)    Mobility walks

## 2017-12-28 NOTE — Transfer of Care (Signed)
Immediate Anesthesia Transfer of Care Note  Patient: Diane Fletcher  Procedure(s) Performed: INCISION AND DRAINAGE LEFT PERITONSILAR ABSCESS (Left )  Patient Location: PACU  Anesthesia Type:General  Level of Consciousness: awake and alert   Airway & Oxygen Therapy: Patient Spontanous Breathing and Patient connected to face mask oxygen  Post-op Assessment: Report given to RN and Post -op Vital signs reviewed and stable  Post vital signs: Reviewed and stable  Last Vitals:  Vitals Value Taken Time  BP 142/98 12/28/2017  7:15 PM  Temp 36.6 C 12/28/2017  7:15 PM  Pulse 108 12/28/2017  7:27 PM  Resp 22 12/28/2017  7:27 PM  SpO2 92 % 12/28/2017  7:27 PM  Vitals shown include unvalidated device data.  Last Pain:  Vitals:   12/28/17 1456  TempSrc:   PainSc: 5          Complications: No apparent anesthesia complications

## 2017-12-28 NOTE — ED Notes (Signed)
Bed: WTR6 Expected date:  Expected time:  Means of arrival:  Comments: 

## 2017-12-28 NOTE — Op Note (Signed)
12/28/2017 7:02 PM    Ballantine, Diane Fletcher  [id number]   Pre-Op Dx:  LEFT Peritonsillar Abscess  Post-op Dx: same  Proc:  I&D LEFT PTA  Surg:  Flo ShanksWOLICKI, Aulden Calise T MD  Anes:  GOT  EBL:  min  Comp:  none  Findings:  Deep LEFT peritonsillar abscess with 10-15 cc frank pus.  Procedure: With the patient in a comfortable supine position, GOT was administered in standard fashion.    At an appropriate level,  the table was turned 90 degrees away from anesthesia  and placed in Trendelenberg position. Routine clean preparation and draping was performed. Taking care to protect lips, teeth and endotracheal tube, the Crowe-Davis mouth gag was introduced, expanded for visualization, and suspended from the Mayo stand in the standard fashion.  The findings were as described as above.  Electrocautery was used to perform a crescent incision above the tonsil.  This was carried down on the capsule of the tonsil.  A frank abscess cavity was encountered, and widely opened.  Hemostasis was spontaneous.  The cavity was irrigated with sterile saline.  The mouth gag was relaxed for several minutes.  Upon re-expansion, hemostasis was observed.  At this point the procedure was completed.  The mouth gag was relaxed and removed.  The dental status was  Intact.  The patient was returned to Anesthesia, awakened, extubated, and transferred to PACU in stable condition.    Dispo:   PACU to Home  Plan:  Hydration, antibiosis, analgesia.   Given low anticipated risk of post-anesthetic or post-surgical complications, feel an outpatient venue is appropriate.  Cephus RicherWOLICKI,  Tashan Kreitzer T MD

## 2017-12-29 NOTE — Anesthesia Postprocedure Evaluation (Signed)
Anesthesia Post Note  Patient: Earmon Phoenixmanda Voland  Procedure(s) Performed: INCISION AND DRAINAGE LEFT PERITONSILAR ABSCESS (Left )     Patient location during evaluation: PACU Anesthesia Type: General Level of consciousness: awake and alert Pain management: pain level controlled Vital Signs Assessment: post-procedure vital signs reviewed and stable Respiratory status: spontaneous breathing, nonlabored ventilation, respiratory function stable and patient connected to nasal cannula oxygen Cardiovascular status: blood pressure returned to baseline and stable Postop Assessment: no apparent nausea or vomiting Anesthetic complications: no    Last Vitals:  Vitals:   12/28/17 1943 12/28/17 1956  BP: (!) 141/85 140/89  Pulse: (!) 105 (!) 102  Resp: 19 18  Temp: 36.5 C 36.5 C  SpO2: 96%     Last Pain:  Vitals:   12/28/17 1956  TempSrc:   PainSc: 0-No pain                 Sherina Stammer

## 2017-12-30 ENCOUNTER — Encounter (HOSPITAL_COMMUNITY): Payer: Self-pay | Admitting: Otolaryngology

## 2018-12-21 ENCOUNTER — Inpatient Hospital Stay
Admit: 2018-12-21 | Discharge: 2018-12-21 | Disposition: A | Discharge status: Home / Self Care | Attending: Emergency Medicine

## 2018-12-21 DIAGNOSIS — N390 Urinary tract infection, site not specified: Secondary | ICD-10-CM

## 2018-12-21 LAB — MICROSCOPIC URINALYSIS
Casts UA: 5 /LPF (ref 0–8)
Epithelial Cells UA: 10 /HPF (ref 0–5)
RBC, UA: 2 /HPF (ref 0–4)
WBC, UA: 20 /HPF (ref 0–5)

## 2018-12-21 LAB — URINALYSIS WITH REFLEX TO CULTURE
Bilirubin Urine: NEGATIVE
Glucose, Ur: NEGATIVE
Ketones, Urine: NEGATIVE
Leukocyte Esterase, Urine: NEGATIVE
Nitrite, Urine: NEGATIVE
Protein, UA: NEGATIVE
Specific Gravity, UA: 1.021 (ref 1.005–1.030)
Urine Hgb: NEGATIVE
Urobilinogen, Urine: NORMAL
pH, UA: 5.5 (ref 5.0–8.0)

## 2018-12-21 MED ORDER — CYCLOBENZAPRINE HCL 5 MG PO TABS
5 MG | ORAL_TABLET | Freq: Two times a day (BID) | ORAL | 0 refills | Status: AC | PRN
Start: 2018-12-21 — End: 2018-12-31

## 2018-12-21 MED ORDER — IBUPROFEN 400 MG PO TABS
400 MG | Freq: Once | ORAL | Status: AC
Start: 2018-12-21 — End: 2018-12-21
  Administered 2018-12-21: 19:00:00 400 mg via ORAL

## 2018-12-21 MED ORDER — LIDOCAINE 4 % EX PTCH
4 % | Freq: Every day | CUTANEOUS | Status: DC
Start: 2018-12-21 — End: 2018-12-21
  Administered 2018-12-21: 19:00:00 1 via TRANSDERMAL

## 2018-12-21 MED ORDER — IBUPROFEN 400 MG PO TABS
400 MG | ORAL_TABLET | Freq: Four times a day (QID) | ORAL | 0 refills | Status: DC | PRN
Start: 2018-12-21 — End: 2019-06-01

## 2018-12-21 MED FILL — ASPERCREME LIDOCAINE 4 % EX PTCH: 4 % | CUTANEOUS | Qty: 1

## 2018-12-21 MED FILL — IBUPROFEN 400 MG PO TABS: 400 MG | ORAL | Qty: 1

## 2018-12-21 NOTE — ED Notes (Signed)
Report received from [facility].  This caregiver met patient, resting quietly, no new change in status.       Judie Grieve, RN  12/21/18 1527

## 2018-12-21 NOTE — ED Provider Notes (Signed)
[facility]     Emergency Department     Faculty Attestation    I performed a history and physical examination of the patient and discussed management with the resident. I have reviewed and agree with the resident???s findings including all diagnostic interpretations, and treatment plans as written at the time of my review. Any areas of disagreement are noted on the chart. I was personally present for the key portions of any procedures. I have documented in the chart those procedures where I was not present during the key portions. For Physician Assistant/ Nurse Practitioner cases/documentation I have personally evaluated this patient and have completed at least one if not all key elements of the E/M (history, physical exam, and MDM). Additional findings are as noted.    This patient was evaluated in the Emergency Department for symptoms described in the history of present illness. The patient was evaluated in the context of the global COVID-19 pandemic, which necessitated consideration that the patient might be at risk for infection with the SARS-CoV-2 virus that causes COVID-19. Institutional protocols and algorithms that pertain to the evaluation of patients at risk for COVID-19 are in a state of rapid change based on information released by regulatory bodies including the CDC and federal and state organizations. These policies and algorithms were followed during the patient's care in the ED.    Primary Care Physician: No primary care provider on file.    History: This is a 33 y.o. female who presents to the Emergency Department with complaint of back pain.  The patient presents emerged from complaint of left-sided back pain that occurred approximate 4 days ago when she is bending over to pull the dog's ball.  Since then she has been taking over-the-counter remedies without improvement of his symptoms.  Patient denies any bowel or bladder dysfunction.  The  patient denies any numbness, tingling or weakness.  Any type of stretching walking increases the pain.  Nothing helps alleviate the pain.    Physical:   temporal temperature is 97.3 ??F (36.3 ??C). Her blood pressure is 139/85 and her pulse is 102. Her respiration is 20 and oxygen saturation is 98%.  There is tenderness to palpation along superior left iliac crest on the back.  There is no midline lumbar thoracic spinal tenderness.  There is no CVA tenderness.  Patient moves all extremities well.    Impression: Low back pain    Plan: Analgesia, urinalysis        (Please note that portions of this note were completed with a voice recognition program.  Efforts were made to edit the dictations but occasionally words are mis-transcribed.)    Belenda Cruise P. Eulah Pont, MD, [facility]  Attending Emergency Medicine Physician        Scot Dock, MD  12/21/18 [phone]

## 2018-12-21 NOTE — ED Notes (Signed)
Discharge instructions given by other, pt verbalized understanding.  All questions answered.       Lorelei Pont, RN  12/21/18 [phone]

## 2018-12-21 NOTE — ED Notes (Signed)
Dr. Delorise Shiner at bedside to assess patient.      Araceli Bouche, RN  12/21/18 [phone]

## 2018-12-21 NOTE — ED Notes (Signed)
Pt ambulatory to restroom to collect urine sample.      Araceli Bouche, RN  12/21/18 1318

## 2018-12-21 NOTE — ED Provider Notes (Signed)
[facility]  Emergency Department Encounter  Podiatric Medicine and Surgery Resident     Pt Name:Kaitlyn Day  MRN: [id number]  Birthdate 04/07/1985  Date of evaluation: 12/21/18  PCP:  No primary care provider on file.    CHIEF COMPLAINT       Chief Complaint   Patient presents with   ??? Back Pain   ??? Urinary Tract Infection       HISTORY OF PRESENT ILLNESS  (Location/Symptom, Timing/Onset, Context/Setting, Quality, Duration, Modifying Factors, Severity.)      Kaitlyn Day is a 33 y.o. female who presents with pain to the left side of her back. Patient states that she was pouring water into her dogs dish when she started to notice some lower back pain located on her left side. Reports that over the next couple of days the pain has got worse and now rates the pain at about 6-10.  Patient states that over the last 2 days the pain has coming from the low back and radiate up towards the middle back.  Patient admits that she  experiences pain whenever she tries to bend or twist her back.  Patient says she is taking acetaminophen, aspirin, applied heat packs and ice packs always abdominal to ease her pain.  Patient denies any nausea vomiting fever chills or difficulty urinating.  Patient does admit that a few days ago she did experience some what she believes was cloudy urine but has no other symptoms other than the pain.        PAST MEDICAL / SURGICAL / SOCIAL / FAMILY HISTORY      has no past medical history on file.     has no past surgical history on file.    Social History     Socioeconomic History   ??? Marital status: Single     Spouse name: Not on file   ??? Number of children: Not on file   ??? Years of education: Not on file   ??? Highest education level: Not on file   Occupational History   ??? Not on file   Social Needs   ??? Financial resource strain: Not on file   ??? Food insecurity     Worry: Not on file     Inability: Not on file   ??? Transportation needs     Medical: Not on file      Non-medical: Not on file   Tobacco Use   ??? Smoking status: Current Every Day Smoker   Substance and Sexual Activity   ??? Alcohol use: Yes     Comment: occass   ??? Drug use: No   ??? Sexual activity: Not on file   Lifestyle   ??? Physical activity     Days per week: Not on file     Minutes per session: Not on file   ??? Stress: Not on file   Relationships   ??? Social Wellsite geologistconnections     Talks on phone: Not on file     Gets together: Not on file     Attends religious service: Not on file     Active member of club or organization: Not on file     Attends meetings of clubs or organizations: Not on file     Relationship status: Not on file   ??? Intimate partner violence     Fear of current or ex partner: Not on file     Emotionally abused: Not on file  Physically abused: Not on file     Forced sexual activity: Not on file   Other Topics Concern   ??? Not on file   Social History Narrative   ??? Not on file       Family History   Problem Relation Age of Onset   ??? High Blood Pressure Mother    ??? Ovarian Cancer Mother    ??? Ovarian Cancer Maternal Grandmother    ??? Other Paternal Grandmother         mute   ??? Diabetes Paternal Grandfather    ??? Other Paternal Grandfather         mute       Allergies:  Patient has no known allergies.    Home Medications:  Prior to Admission medications    Medication Sig Start Date End Date Taking? Authorizing Provider   ibuprofen (IBU) 400 MG tablet Take 1 tablet by mouth every 6 hours as needed for Pain May take up to 2 tablets in an 8 hours period as needed for pain. 12/21/18  Yes Danton Sewer, DPM   cyclobenzaprine (FLEXERIL) 5 MG tablet Take 1 tablet by mouth 2 times daily as needed for Muscle spasms 12/21/18 12/31/18 Yes Danton Sewer, DPM       REVIEW OF SYSTEMS    (2-9 systems for level 4, 10 or more for level 5)      Review of Systems   Constitutional: Positive for activity change. Negative for chills and fever.   Eyes: Negative for discharge.   Respiratory: Negative for chest tightness.     Cardiovascular: Negative for chest pain.   Gastrointestinal: Negative for abdominal pain, diarrhea and nausea.   Endocrine: Negative for polyuria.   Genitourinary: Positive for flank pain. Negative for dysuria.   Musculoskeletal: Positive for back pain.   Skin: Negative for color change, pallor, rash and wound.   Neurological: Positive for headaches (admits to getting migraines over the past 15 years).   Psychiatric/Behavioral: Negative for agitation.       PHYSICAL EXAM   (up to 7 for level 4, 8 or more for level 5)      INITIAL VITALS:   BP 139/85    Pulse 102    Temp 97.3 ??F (36.3 ??C) (Temporal)    Resp 20    SpO2 98%     Physical Exam  Constitutional:       General: She is not in acute distress.     Appearance: She is not ill-appearing.   HENT:      Head: Atraumatic.      Right Ear: External ear normal.      Left Ear: External ear normal.   Eyes:      General:         Right eye: No discharge.         Left eye: No discharge.   Neck:      Musculoskeletal: Normal range of motion.   Cardiovascular:      Rate and Rhythm: Tachycardia present.   Pulmonary:      Effort: Pulmonary effort is normal.   Abdominal:      Palpations: Abdomen is soft.      Tenderness: There is no abdominal tenderness.   Musculoskeletal:         General: No tenderness or signs of injury.      Comments: Pain to palpation of the left posterior back muscles/flank   Skin:     Capillary Refill: Capillary  refill takes 2 to 3 seconds.      Coloration: Skin is not jaundiced.      Findings: No erythema or rash.   Neurological:      General: No focal deficit present.         DIFFERENTIAL  DIAGNOSIS     PLAN (LABS / IMAGING / EKG):  Orders Placed This Encounter   Procedures   ??? Urinalysis Reflex to Culture   ??? Microscopic Urinalysis       MEDICATIONS ORDERED:  Orders Placed This Encounter   Medications   ??? ibuprofen (ADVIL;MOTRIN) tablet 400 mg   ??? lidocaine 4 % external patch 1 patch   ??? ibuprofen (IBU) 400 MG tablet     Sig: Take 1 tablet by mouth  every 6 hours as needed for Pain May take up to 2 tablets in an 8 hours period as needed for pain.     Dispense:  120 tablet     Refill:  0   ??? cyclobenzaprine (FLEXERIL) 5 MG tablet     Sig: Take 1 tablet by mouth 2 times daily as needed for Muscle spasms     Dispense:  20 tablet     Refill:  0       DDX:   UTI versus pyelonephritis  Strain lower back muscles    DIAGNOSTIC RESULTS / EMERGENCY DEPARTMENT COURSE / MDM   :  Results for orders placed or performed during the hospital encounter of 12/21/18   Urinalysis Reflex to Culture    Specimen: Urine, clean catch   Result Value Ref Range    Color, UA YELLOW YELLOW    Turbidity UA CLOUDY (A) CLEAR    Glucose, Ur NEGATIVE NEGATIVE    Bilirubin Urine NEGATIVE NEGATIVE    Ketones, Urine NEGATIVE NEGATIVE    Specific Gravity, UA 1.021 1.005 - 1.030    Urine Hgb NEGATIVE NEGATIVE    pH, UA 5.5 5.0 - 8.0    Protein, UA NEGATIVE NEGATIVE    Urobilinogen, Urine Normal Normal    Nitrite, Urine NEGATIVE NEGATIVE    Leukocyte Esterase, Urine NEGATIVE NEGATIVE    Urinalysis Comments NOT REPORTED        IMPRESSION: Patient presents with lower back pain, urinalysis was negative.  Likely a musculoskeletal strain.  Patient's left lower is more tense on exam.    RADIOLOGY:  None    EKG  None    All EKG's are interpreted by the Emergency Department Physician who either signs or Co-signs this chart in the absence of a cardiologist.    EMERGENCY DEPARTMENT COURSE:  Patient seen and evaluated  UA with reflex ordered  400 mg ibuprofen ordered for patient  4% lidocaine patch applied to patient's left lower back.  Patient reports minimal pain relief from the 400 mg ibuprofen and 4% lidocaine patch.  Urinalysis negative for UTI  Patient given prescription for Flexeril and ibuprofen      PROCEDURES:  None    CONSULTS:  None    CRITICAL CARE:  None    FINAL IMPRESSION      1. Urinary tract infection without hematuria, site unspecified    2. Repetitive strain injury of lower back, initial  encounter          DISPOSITION / PLAN     DISPOSITION Decision To Discharge 12/21/2018 03:14:00 PM  DC home with flexeril and ibuprofen prescription.  Stretching exercises given to patient.    PATIENT REFERRED TO:  [facility]  [address]  [place] [address]  [phone]    If symptoms worsen      DISCHARGE MEDICATIONS:  New Prescriptions    CYCLOBENZAPRINE (FLEXERIL) 5 MG TABLET    Take 1 tablet by mouth 2 times daily as needed for Muscle spasms    IBUPROFEN (IBU) 400 MG TABLET    Take 1 tablet by mouth every 6 hours as needed for Pain May take up to 2 tablets in an 8 hours period as needed for pain.       Danton Sewer, DPM  Podiatric Medicine and Surgery Resident    (Please note that portions of thisnote were completed with a voice recognition program.  Efforts were made to edit the dictations but occasionally words are mis-transcribed.)     Danton Sewer, DPM  Resident  12/21/18 1517       Danton Sewer, DPM  Resident  12/21/18 [phone]

## 2018-12-22 LAB — CULTURE, URINE: Culture: NO GROWTH

## 2019-06-01 ENCOUNTER — Emergency Department: Admit: 2019-06-01

## 2019-06-01 ENCOUNTER — Inpatient Hospital Stay
Admission: EM | Admit: 2019-06-01 | Discharge: 2019-06-02 | Disposition: A | Discharge status: Home / Self Care | Source: Other Acute Inpatient Hospital | Admitting: Emergency Medicine

## 2019-06-01 ENCOUNTER — Emergency Department

## 2019-06-01 DIAGNOSIS — J36 Peritonsillar abscess: Secondary | ICD-10-CM

## 2019-06-01 LAB — COMPREHENSIVE METABOLIC PANEL
ALT: 12 U/L (ref 5–33)
AST: 13 U/L (ref <32)
Albumin/Globulin Ratio: 1.1 (ref 1.0–2.5)
Albumin: 4.1 g/dL (ref 3.5–5.2)
Alkaline Phosphatase: 92 U/L (ref 35–104)
Anion Gap: 12 mmol/L (ref 9–17)
BUN: 8 mg/dL (ref 6–20)
CO2: 22 mmol/L (ref 20–31)
Calcium: 8.8 mg/dL (ref 8.6–10.4)
Chloride: 103 mmol/L (ref 98–107)
Creatinine: 0.49 mg/dL — ABNORMAL LOW (ref 0.50–0.90)
GFR African American: >60 mL/min (ref >60)
GFR Non-African American: >60 mL/min (ref >60)
Glucose: 90 mg/dL (ref 70–99)
Potassium: 3.7 mmol/L (ref 3.7–5.3)
Sodium: 137 mmol/L (ref 135–144)
Total Bilirubin: 0.26 mg/dL — ABNORMAL LOW (ref 0.3–1.2)
Total Protein: 8 g/dL (ref 6.4–8.3)

## 2019-06-01 LAB — CBC
Hematocrit: 44.5 % (ref 36.3–47.1)
Hemoglobin: 14.1 g/dL (ref 11.9–15.1)
MCH: 27.6 pg (ref 25.2–33.5)
MCHC: 31.7 g/dL (ref 28.4–34.8)
MCV: 87.1 fL (ref 82.6–102.9)
MPV: 9.9 fL (ref 8.1–13.5)
NRBC Automated: 0 per 100 WBC
Platelets: 309 10*3/uL (ref 138–453)
RBC: 5.11 m/uL (ref 3.95–5.11)
RDW: 13.2 % (ref 11.8–14.4)
WBC: 14.1 10*3/uL — ABNORMAL HIGH (ref 3.5–11.3)

## 2019-06-01 LAB — BASIC METABOLIC PANEL
Anion Gap: 10 mmol/L (ref 9–17)
BUN: 8 mg/dL (ref 6–20)
CO2: 24 mmol/L (ref 20–31)
Calcium: 9 mg/dL (ref 8.6–10.4)
Chloride: 103 mmol/L (ref 98–107)
Creatinine: 0.48 mg/dL — ABNORMAL LOW (ref 0.50–0.90)
GFR African American: >60 mL/min (ref >60)
GFR Non-African American: >60 mL/min (ref >60)
Glucose: 91 mg/dL (ref 70–99)
Potassium: 3.7 mmol/L (ref 3.7–5.3)
Sodium: 137 mmol/L (ref 135–144)

## 2019-06-01 LAB — PROTIME-INR
INR: 1
Protime: 10.6 s (ref 9.1–12.3)

## 2019-06-01 LAB — STREP SCREEN GROUP A THROAT

## 2019-06-01 LAB — APTT: PTT: 27.6 s (ref 20.5–30.5)

## 2019-06-01 LAB — LACTIC ACID, WHOLE BLOOD: Lactic Acid, Whole Blood: 0.8 mmol/L (ref 0.7–2.1)

## 2019-06-01 MED ORDER — SODIUM CHLORIDE 0.9 % IV SOLN
0.9 | INTRAVENOUS | Status: DC | PRN
Start: 2019-06-01 — End: 2019-06-02

## 2019-06-01 MED ORDER — FENTANYL CITRATE (PF) 100 MCG/2ML IJ SOLN
100 | INTRAMUSCULAR | Status: DC | PRN
Start: 2019-06-01 — End: 2019-06-02

## 2019-06-01 MED ORDER — CLINDAMYCIN PHOSPHATE IN D5W 900 MG/50ML IV SOLN
90050 MG/50ML | Freq: Once | INTRAVENOUS | Status: AC
Start: 2019-06-01 — End: 2019-06-01
  Administered 2019-06-01: 20:00:00 900 mg via INTRAVENOUS

## 2019-06-01 MED ORDER — NORMAL SALINE FLUSH 0.9 % IV SOLN
0.9 | INTRAVENOUS | Status: DC | PRN
Start: 2019-06-01 — End: 2019-06-02
  Administered 2019-06-02: 06:00:00 10 mL via INTRAVENOUS

## 2019-06-01 MED ORDER — CLINDAMYCIN PHOSPHATE IN D5W 900 MG/50ML IV SOLN
90050 MG/50ML | Freq: Three times a day (TID) | INTRAVENOUS | Status: DC
Start: 2019-06-01 — End: 2019-06-02
  Administered 2019-06-02 (×2): 900 mg via INTRAVENOUS

## 2019-06-01 MED ORDER — METHYLPREDNISOLONE SODIUM SUCC 125 MG IJ SOLR
125 MG | Freq: Two times a day (BID) | INTRAMUSCULAR | Status: DC
Start: 2019-06-01 — End: 2019-06-02
  Administered 2019-06-02: 06:00:00 125 mg via INTRAVENOUS

## 2019-06-01 MED ORDER — IOPAMIDOL 76 % IV SOLN
76 % | Freq: Once | INTRAVENOUS | Status: AC | PRN
Start: 2019-06-01 — End: 2019-06-01
  Administered 2019-06-01: 20:00:00 75 mL via INTRAVENOUS

## 2019-06-01 MED ORDER — FENTANYL CITRATE (PF) 100 MCG/2ML IJ SOLN
100 | INTRAMUSCULAR | Status: DC | PRN
Start: 2019-06-01 — End: 2019-06-02
  Administered 2019-06-01: 22:00:00 50 ug via INTRAVENOUS

## 2019-06-01 MED ORDER — ACETAMINOPHEN 325 MG PO TABS
325 | ORAL | Status: DC | PRN
Start: 2019-06-01 — End: 2019-06-02

## 2019-06-01 MED ORDER — NORMAL SALINE FLUSH 0.9 % IV SOLN
0.9 | Freq: Two times a day (BID) | INTRAVENOUS | Status: DC
Start: 2019-06-01 — End: 2019-06-02
  Administered 2019-06-02: 13:00:00 10 mL via INTRAVENOUS

## 2019-06-01 MED ORDER — OXYCODONE HCL 5 MG PO TABS
5 | ORAL | Status: DC | PRN
Start: 2019-06-01 — End: 2019-06-02

## 2019-06-01 MED ORDER — METHYLPREDNISOLONE SODIUM SUCC 125 MG IJ SOLR
125 MG | Freq: Once | INTRAMUSCULAR | Status: AC
Start: 2019-06-01 — End: 2019-06-01
  Administered 2019-06-01: 17:00:00 125 mg via INTRAVENOUS

## 2019-06-01 MED ORDER — VANCOMYCIN 1750 MG IN D5W 500 ML IVPB
Freq: Once | Status: AC
Start: 2019-06-01 — End: 2019-06-01
  Administered 2019-06-01: 1750 mg/kg via INTRAVENOUS

## 2019-06-01 MED FILL — SOLU-MEDROL 125 MG IJ SOLR: 125 mg | INTRAMUSCULAR | Qty: 125

## 2019-06-01 MED FILL — VANCOMYCIN 1750 MG IN D5W 500 ML IVPB: Qty: 1750

## 2019-06-01 MED FILL — VANCOMYCIN HCL 10 G IV SOLR: 10 g | INTRAVENOUS | Qty: 1750

## 2019-06-01 MED FILL — CLINDAMYCIN PHOSPHATE IN D5W 900 MG/50ML IV SOLN: 900 MG/50ML | INTRAVENOUS | Qty: 50

## 2019-06-01 MED FILL — FENTANYL CITRATE (PF) 100 MCG/2ML IJ SOLN: 100 MCG/2ML | INTRAMUSCULAR | Qty: 2

## 2019-06-02 LAB — URINALYSIS WITH MICROSCOPIC
Bilirubin Urine: NEGATIVE
Casts UA: 0 /LPF (ref 0–2)
Epithelial Cells UA: 2 /HPF (ref 0–5)
Leukocyte Esterase, Urine: NEGATIVE
Nitrite, Urine: NEGATIVE
Specific Gravity, UA: 1.068 — ABNORMAL HIGH (ref 1.005–1.030)
Urine Hgb: NEGATIVE
Urobilinogen, Urine: NORMAL
pH, UA: 6 (ref 5.0–8.0)

## 2019-06-02 LAB — CULTURE, URINE: Culture: NO GROWTH

## 2019-06-02 LAB — COVID-19, RAPID: SARS-CoV-2, Rapid: NOT DETECTED

## 2019-06-02 MED ORDER — AMOXICILLIN-POT CLAVULANATE 875-125 MG PO TABS
875-125 MG | ORAL_TABLET | Freq: Two times a day (BID) | ORAL | 0 refills | Status: AC
Start: 2019-06-02 — End: 2019-06-12

## 2019-06-02 MED FILL — CLINDAMYCIN PHOSPHATE IN D5W 900 MG/50ML IV SOLN: 900 MG/50ML | INTRAVENOUS | Qty: 50

## 2019-06-02 MED FILL — SOLU-MEDROL 125 MG IJ SOLR: 125 mg | INTRAMUSCULAR | Qty: 125

## 2019-06-03 ENCOUNTER — Emergency Department: Admit: 2019-06-03

## 2019-06-03 ENCOUNTER — Inpatient Hospital Stay
Admission: EM | Admit: 2019-06-03 | Discharge: 2019-06-04 | Discharge status: Against Medical Advice | Source: Other Acute Inpatient Hospital | Admitting: Pulmonary Disease

## 2019-06-03 DIAGNOSIS — J36 Peritonsillar abscess: Principal | ICD-10-CM

## 2019-06-03 LAB — CBC WITH AUTO DIFFERENTIAL
Absolute Eos #: <0.03 10*3/uL (ref 0.00–0.44)
Absolute Immature Granulocyte: 0.06 10*3/uL (ref 0.00–0.30)
Absolute Lymph #: 3.07 10*3/uL (ref 1.10–3.70)
Absolute Mono #: 0.85 10*3/uL (ref 0.10–1.20)
Basophils Absolute: 0.04 10*3/uL (ref 0.00–0.20)
Basophils: 0 % (ref 0–2)
Eosinophils %: 0 % — ABNORMAL LOW (ref 1–4)
Hematocrit: 42.8 % (ref 36.3–47.1)
Hemoglobin: 13.6 g/dL (ref 11.9–15.1)
Immature Granulocytes: 1 % — ABNORMAL HIGH
Lymphocytes: 24 % (ref 24–43)
MCH: 27.7 pg (ref 25.2–33.5)
MCHC: 31.8 g/dL (ref 28.4–34.8)
MCV: 87.2 fL (ref 82.6–102.9)
MPV: 9.5 fL (ref 8.1–13.5)
Monocytes: 7 % (ref 3–12)
NRBC Automated: 0 per 100 WBC
Platelets: 274 10*3/uL (ref 138–453)
RBC: 4.91 m/uL (ref 3.95–5.11)
RDW: 13.8 % (ref 11.8–14.4)
Seg Neutrophils: 68 % — ABNORMAL HIGH (ref 36–65)
Segs Absolute: 8.85 10*3/uL — ABNORMAL HIGH (ref 1.50–8.10)
WBC: 12.9 10*3/uL — ABNORMAL HIGH (ref 3.5–11.3)

## 2019-06-03 LAB — MICROSCOPIC URINALYSIS
Epithelial Cells UA: 2 /HPF (ref 0–5)
RBC, UA: 0 /HPF (ref 0–4)
WBC, UA: 0 /HPF (ref 0–5)

## 2019-06-03 LAB — URINALYSIS WITH REFLEX TO CULTURE
Bilirubin Urine: NEGATIVE
Glucose, Ur: NEGATIVE
Ketones, Urine: NEGATIVE
Nitrite, Urine: NEGATIVE
Protein, UA: NEGATIVE
Specific Gravity, UA: 1.033 — ABNORMAL HIGH (ref 1.005–1.030)
Urine Hgb: NEGATIVE
Urobilinogen, Urine: NORMAL
pH, UA: 6 (ref 5.0–8.0)

## 2019-06-03 LAB — HEPATIC FUNCTION PANEL
ALT: 12 U/L (ref 5–33)
AST: 11 U/L (ref <32)
Albumin/Globulin Ratio: 1.2 (ref 1.0–2.5)
Albumin: 3.8 g/dL (ref 3.5–5.2)
Alkaline Phosphatase: 77 U/L (ref 35–104)
Bilirubin, Direct: <0.08 mg/dL (ref <0.31)
Total Bilirubin: 0.15 mg/dL — ABNORMAL LOW (ref 0.3–1.2)
Total Protein: 7.1 g/dL (ref 6.4–8.3)

## 2019-06-03 LAB — LACTATE, SEPSIS
Lactic Acid, Sepsis, Whole Blood: 0.9 mmol/L (ref 0.5–1.9)
Lactic Acid, Sepsis, Whole Blood: 0.9 mmol/L (ref 0.5–1.9)

## 2019-06-03 LAB — BASIC METABOLIC PANEL W/ REFLEX TO MG FOR LOW K
Anion Gap: 11 mmol/L (ref 9–17)
BUN: 13 mg/dL (ref 6–20)
CO2: 26 mmol/L (ref 20–31)
Calcium: 8.4 mg/dL — ABNORMAL LOW (ref 8.6–10.4)
Chloride: 104 mmol/L (ref 98–107)
Creatinine: 0.52 mg/dL (ref 0.50–0.90)
GFR African American: >60 mL/min (ref >60)
GFR Non-African American: >60 mL/min (ref >60)
Glucose: 81 mg/dL (ref 70–99)
Potassium: 3.4 mmol/L — ABNORMAL LOW (ref 3.7–5.3)
Sodium: 141 mmol/L (ref 135–144)

## 2019-06-03 LAB — PROTIME-INR
INR: 1
Protime: 10.6 s (ref 9.1–12.3)

## 2019-06-03 LAB — APTT: PTT: 25.5 s (ref 20.5–30.5)

## 2019-06-03 LAB — MAGNESIUM: Magnesium: 2.2 mg/dL (ref 1.6–2.6)

## 2019-06-03 MED ORDER — ACETAMINOPHEN 325 MG PO TABS
325 | ORAL | Status: DC | PRN
Start: 2019-06-03 — End: 2019-06-04

## 2019-06-03 MED ORDER — NALOXONE HCL 0.4 MG/ML IJ SOLN
0.4 MG/ML | INTRAMUSCULAR | Status: DC | PRN
Start: 2019-06-03 — End: 2019-06-04

## 2019-06-03 MED ORDER — SODIUM CHLORIDE 0.9 % IV BOLUS
0.9 % | Freq: Once | INTRAVENOUS | Status: AC
Start: 2019-06-03 — End: 2019-06-03
  Administered 2019-06-03: 20:00:00 1000 mL via INTRAVENOUS

## 2019-06-03 MED ORDER — DEXAMETHASONE SODIUM PHOSPHATE 4 MG/ML IJ SOLN
4 MG/ML | Freq: Once | INTRAMUSCULAR | Status: AC
Start: 2019-06-03 — End: 2019-06-03
  Administered 2019-06-03: 20:00:00 4 mg via INTRAVENOUS

## 2019-06-03 MED ORDER — ENOXAPARIN SODIUM 40 MG/0.4ML SC SOLN
400.4 MG/0.4ML | Freq: Every day | SUBCUTANEOUS | Status: DC
Start: 2019-06-03 — End: 2019-06-04

## 2019-06-03 MED ORDER — FENTANYL CITRATE (PF) 100 MCG/2ML IJ SOLN
100 MCG/2ML | Freq: Once | INTRAMUSCULAR | Status: AC
Start: 2019-06-03 — End: 2019-06-03
  Administered 2019-06-03: 23:00:00 50 ug via INTRAVENOUS

## 2019-06-03 MED ORDER — DEXTROSE IN LACTATED RINGERS 5 % IV SOLN
5 % | INTRAVENOUS | Status: DC
Start: 2019-06-03 — End: 2019-06-04
  Administered 2019-06-04: 01:00:00 via INTRAVENOUS

## 2019-06-03 MED ORDER — METRONIDAZOLE (FLAGYL) IVPB
Freq: Once | INTRAVENOUS | Status: DC
Start: 2019-06-03 — End: 2019-06-03

## 2019-06-03 MED ORDER — LACTATED RINGERS IV SOLN
INTRAVENOUS | Status: DC
Start: 2019-06-03 — End: 2019-06-03
  Administered 2019-06-03: 23:00:00 via INTRAVENOUS

## 2019-06-03 MED ORDER — SODIUM CHLORIDE 0.9 % IV SOLN
0.9 | INTRAVENOUS | Status: DC | PRN
Start: 2019-06-03 — End: 2019-06-04

## 2019-06-03 MED ORDER — METRONIDAZOLE IN NACL 5-0.79 MG/ML-% IV SOLN
5 mg/mL | Freq: Once | INTRAVENOUS | Status: AC
Start: 2019-06-03 — End: 2019-06-03
  Administered 2019-06-03: 22:00:00 500 mg via INTRAVENOUS

## 2019-06-03 MED ORDER — LORAZEPAM 2 MG/ML IJ SOLN
2 MG/ML | Freq: Four times a day (QID) | INTRAMUSCULAR | Status: DC | PRN
Start: 2019-06-03 — End: 2019-06-03
  Administered 2019-06-03: 23:00:00 1 mg via INTRAVENOUS

## 2019-06-03 MED ORDER — CEFTRIAXONE SODIUM 1 G IJ SOLR
1 g | Freq: Once | INTRAMUSCULAR | Status: AC
Start: 2019-06-03 — End: 2019-06-03
  Administered 2019-06-03: 22:00:00 1000 mg via INTRAVENOUS

## 2019-06-03 MED ORDER — FENTANYL CITRATE (PF) 100 MCG/2ML IJ SOLN
100 MCG/2ML | INTRAMUSCULAR | Status: DC | PRN
Start: 2019-06-03 — End: 2019-06-03

## 2019-06-03 MED ORDER — AMPICILLIN-SULBACTAM 1500 (1000-500) MG IJ (MIXTURES ONLY)
1.51-0.5 (1-0.5) g | Freq: Once | INTRAVENOUS | Status: AC
Start: 2019-06-03 — End: 2019-06-03
  Administered 2019-06-03: 21:00:00 1500 mg via INTRAVENOUS

## 2019-06-03 MED ORDER — AMPICILLIN-SULBACTAM SODIUM 3 (2-1) G IJ SOLR
32-1 (2-1) g | Freq: Four times a day (QID) | INTRAMUSCULAR | Status: DC
Start: 2019-06-03 — End: 2019-06-04
  Administered 2019-06-04 (×2): 3000 mg via INTRAVENOUS

## 2019-06-03 MED FILL — CEFTRIAXONE SODIUM 1 G IJ SOLR: 1 g | INTRAMUSCULAR | Qty: 1000

## 2019-06-03 MED FILL — FENTANYL CITRATE (PF) 100 MCG/2ML IJ SOLN: 100 MCG/2ML | INTRAMUSCULAR | Qty: 2

## 2019-06-03 MED FILL — AMPICILLIN-SULBACTAM SODIUM 3 (2-1) G IJ SOLR: 3 (2-1) g | INTRAMUSCULAR | Qty: 3000

## 2019-06-03 MED FILL — DEXTROSE IN LACTATED RINGERS 5 % IV SOLN: 5 % | INTRAVENOUS | Qty: 1000

## 2019-06-03 MED FILL — METRONIDAZOLE IN NACL 5-0.79 MG/ML-% IV SOLN: 5 mg/mL | INTRAVENOUS | Qty: 100

## 2019-06-03 MED FILL — DEXAMETHASONE SODIUM PHOSPHATE 4 MG/ML IJ SOLN: 4 mg/mL | INTRAMUSCULAR | Qty: 1

## 2019-06-03 MED FILL — LORAZEPAM 2 MG/ML IJ SOLN: 2 mg/mL | INTRAMUSCULAR | Qty: 1

## 2019-06-03 MED FILL — UNASYN 1.5 (1-0.5) G IJ SOLR: 1.5 (1-0.5) g | INTRAMUSCULAR | Qty: 1500

## 2019-06-03 NOTE — Telephone Encounter (Signed)
Reason for Disposition  ??? Unable to open mouth completely    Answer Assessment - Initial Assessment Questions  1. ONSET: "When did the throat start hurting?" (Hours or days ago)       Pt just left ED yesterday and states the pain is worse in throat and swelling in face.- pt told to return with worsening of symptoms.     2. SEVERITY: "How bad is the sore throat?" (Scale 1-10; mild, moderate or severe)    - MILD (1-3):  doesn't interfere with eating or normal activities    - MODERATE (4-7): interferes with eating some solids and normal activities    - SEVERE (8-10):  excruciating pain, interferes with most normal activities    - SEVERE DYSPHAGIA: can't swallow liquids, drooling             Severe- pt is crying and is in severe pain     3. STREP EXPOSURE: "Has there been any exposure to strep within the past week?" If so, ask: "What type of contact occurred?"         4.  VIRAL SYMPTOMS: "Are there any symptoms of a cold, such as a runny nose, cough, hoarse voice or red eyes?"         5. FEVER: "Do you have a fever?" If so, ask: "What is your temperature, how was it measured, and when did it start?"        6. PUS ON THE TONSILS: "Is there pus on the tonsils in the back of your throat?"        7. OTHER SYMPTOMS: "Do you have any other symptoms?" (e.g., difficulty breathing, headache, rash)        8. PREGNANCY: "Is there any chance you are pregnant?" "When was your last menstrual period?"    Protocols used: SORE THROAT-ADULT-OH    Received call from [employer]  at pre-service center [facility]) [facility] with [employer] Complaint.    Brief description of triage: see above     Triage indicates for patient to go to ED now for sore throat pain. Pt was in ED and told to return with worsening of symptoms. Pt is having severe pain and swelling in throat. Denies difficulty with breathing. Pt is going to call mom to take her to ED and if she is not able pt will call 911 for transport.     Care advice provided, patient verbalizes  understanding; denies any other questions or concerns; instructed to call back for any new or worsening symptoms.    Attention Provider:  Thank you for allowing me to participate in the care of your patient.  The patient was connected to triage in response to information provided to the ECC.  Please do not respond through this encounter as the response is not directed to a shared pool.

## 2019-06-04 LAB — CULTURE, URINE

## 2019-06-04 MED ORDER — GLUCAGON HCL RDNA (DIAGNOSTIC) 1 MG IJ SOLR
1 MG | INTRAMUSCULAR | Status: DC | PRN
Start: 2019-06-04 — End: 2019-06-04

## 2019-06-04 MED ORDER — FENTANYL CITRATE (PF) 100 MCG/2ML IJ SOLN
100 MCG/2ML | Freq: Once | INTRAMUSCULAR | Status: AC
Start: 2019-06-04 — End: 2019-06-04
  Administered 2019-06-04: 10:00:00 50 ug via INTRAVENOUS

## 2019-06-04 MED ORDER — TRAMADOL HCL 50 MG PO TABS
50 MG | Freq: Four times a day (QID) | ORAL | Status: DC | PRN
Start: 2019-06-04 — End: 2019-06-04
  Administered 2019-06-04: 14:00:00 25 mg via ORAL

## 2019-06-04 MED ORDER — POTASSIUM CHLORIDE 10 MEQ/100ML IV SOLN
10 MEQ/0ML | Freq: Once | INTRAVENOUS | Status: AC
Start: 2019-06-04 — End: 2019-06-04
  Administered 2019-06-04: 12:00:00 40 meq via INTRAVENOUS

## 2019-06-04 MED ORDER — NORMAL SALINE FLUSH 0.9 % IV SOLN
0.9 | Freq: Two times a day (BID) | INTRAVENOUS | Status: DC
Start: 2019-06-04 — End: 2019-06-04

## 2019-06-04 MED ORDER — DEXTROSE 50 % IV SOLN
50 % | INTRAVENOUS | Status: DC | PRN
Start: 2019-06-04 — End: 2019-06-04

## 2019-06-04 MED ORDER — LORAZEPAM 2 MG/ML IJ SOLN
2 MG/ML | Freq: Four times a day (QID) | INTRAMUSCULAR | Status: DC | PRN
Start: 2019-06-04 — End: 2019-06-04
  Administered 2019-06-04: 06:00:00 2 mg via INTRAVENOUS

## 2019-06-04 MED ORDER — NORMAL SALINE FLUSH 0.9 % IV SOLN
0.9 | INTRAVENOUS | Status: DC | PRN
Start: 2019-06-04 — End: 2019-06-04

## 2019-06-04 MED ORDER — FENTANYL CITRATE (PF) 100 MCG/2ML IJ SOLN
100 MCG/2ML | INTRAMUSCULAR | Status: DC | PRN
Start: 2019-06-04 — End: 2019-06-04
  Administered 2019-06-04: 14:00:00 25 ug via INTRAVENOUS

## 2019-06-04 MED ORDER — DEXTROSE 5 % IV SOLN
5 % | INTRAVENOUS | Status: DC | PRN
Start: 2019-06-04 — End: 2019-06-04

## 2019-06-04 MED ORDER — GLUCOSE 40 % PO GEL
40 % | ORAL | Status: DC | PRN
Start: 2019-06-04 — End: 2019-06-04

## 2019-06-04 MED FILL — AMPICILLIN-SULBACTAM SODIUM 3 (2-1) G IJ SOLR: 3 (2-1) g | INTRAMUSCULAR | Qty: 3000

## 2019-06-04 MED FILL — TRAMADOL HCL 50 MG PO TABS: 50 mg | ORAL | Qty: 1

## 2019-06-04 MED FILL — LORAZEPAM 2 MG/ML IJ SOLN: 2 mg/mL | INTRAMUSCULAR | Qty: 1

## 2019-06-04 MED FILL — FENTANYL CITRATE (PF) 100 MCG/2ML IJ SOLN: 100 MCG/2ML | INTRAMUSCULAR | Qty: 2

## 2019-06-04 MED FILL — POTASSIUM CHLORIDE 10 MEQ/100ML IV SOLN: 10 MEQ/0ML | INTRAVENOUS | Qty: 400

## 2019-06-07 LAB — CULTURE, BLOOD 1
Culture: NO GROWTH
Culture: NO GROWTH

## 2019-06-09 LAB — CULTURE, BLOOD 1
Culture: NO GROWTH
Culture: NO GROWTH

## 2019-10-04 IMAGING — CT CT NECK W/ CM
4 of 5 series · 15 of 33 positions shown, 17 images · IV contrast (iopamidol)
Comparison: CT neck 08/01/2008

CLINICAL DATA: Sore throat stridor. Suspect epiglottitis or
tonsillitis. Leukocytosis

EXAM:
CT NECK WITH CONTRAST
TECHNIQUE: Multidetector CT imaging of the neck was performed using the
standard protocol following the bolus administration of intravenous
contrast.
CONTRAST:  75mL 2HDLDN-J00 IOPAMIDOL (2HDLDN-J00) INJECTION 61%

[Series 3: axial neck · axial · 0.55mm/px · z∈[-198,-90]mm · 3 of 109 slices shown]
[im 28/109  bone]
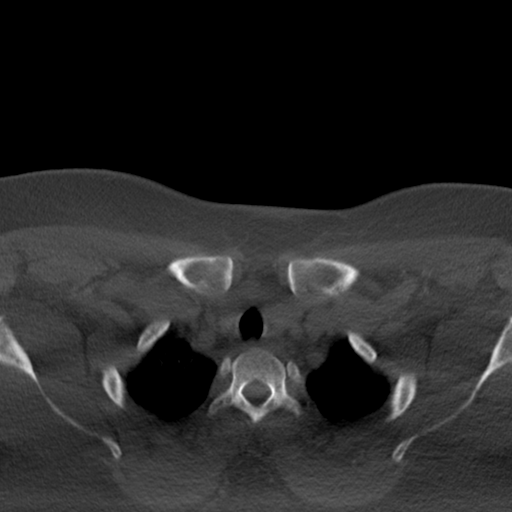
[im 55/109  bone]
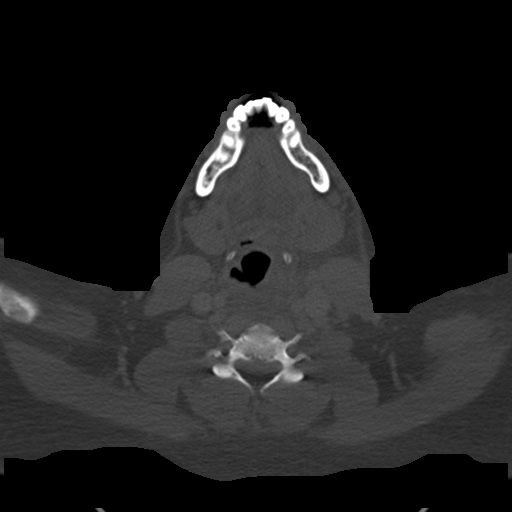
[im 82/109  bone]
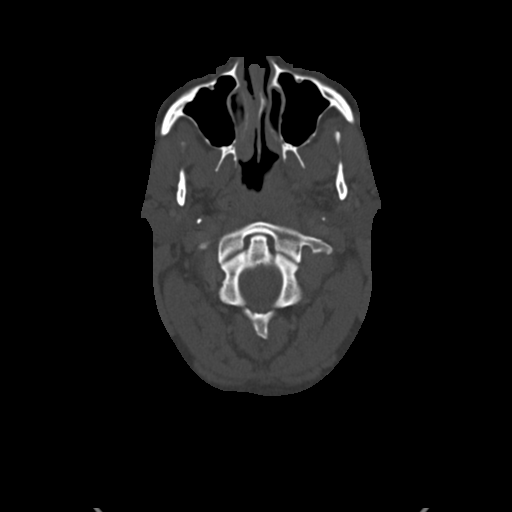

[Series 5: orthogonal ax · axial · 0.39mm/px · z∈[-250,-122]mm · 4 of 121 slices shown, 5 images]
[im 25/121  soft-tissue]
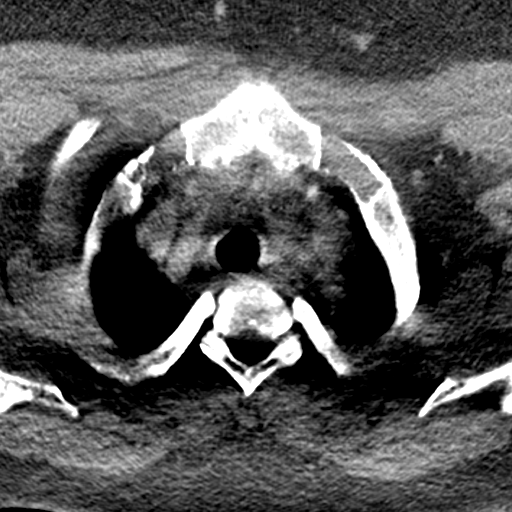
[im 25/121  bone]
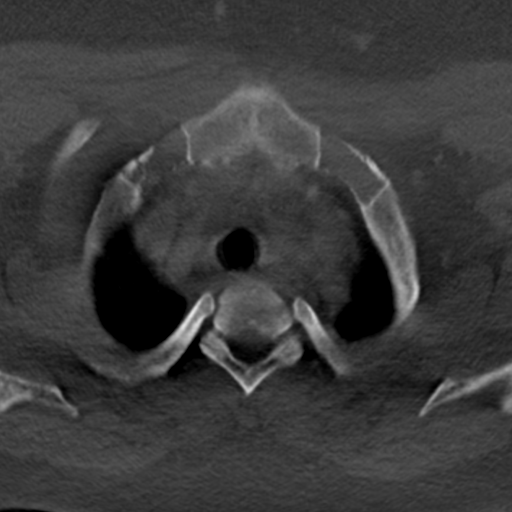
[im 49/121  bone]
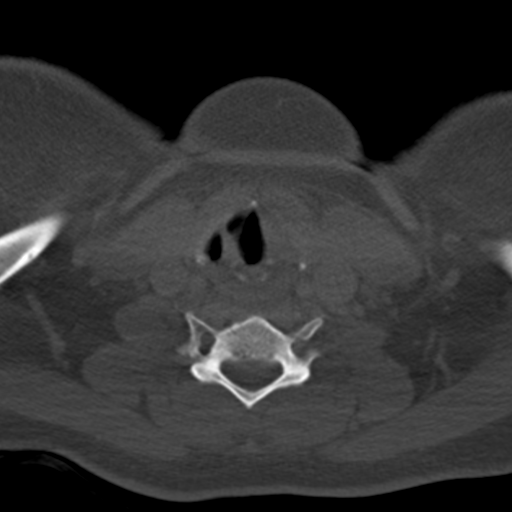
[im 73/121  bone]
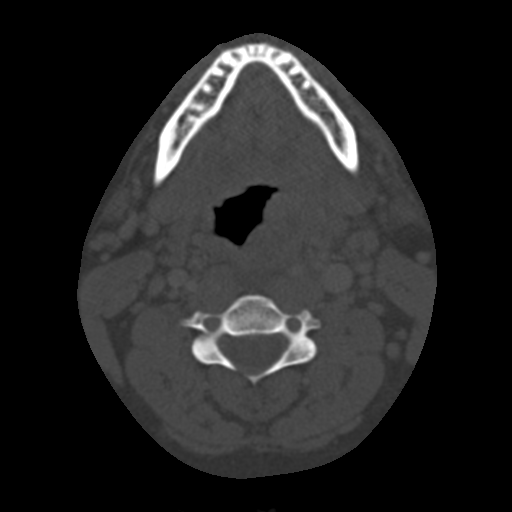
[im 97/121  bone]
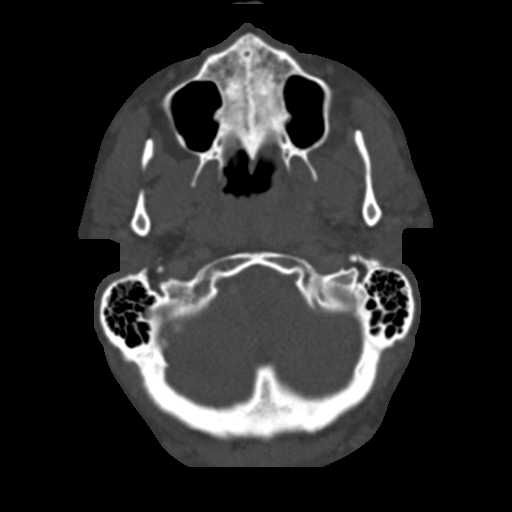

[Series 6: cor neck · coronal · 0.47mm/px · 3 of 99 slices shown]
[im 29/99  bone]
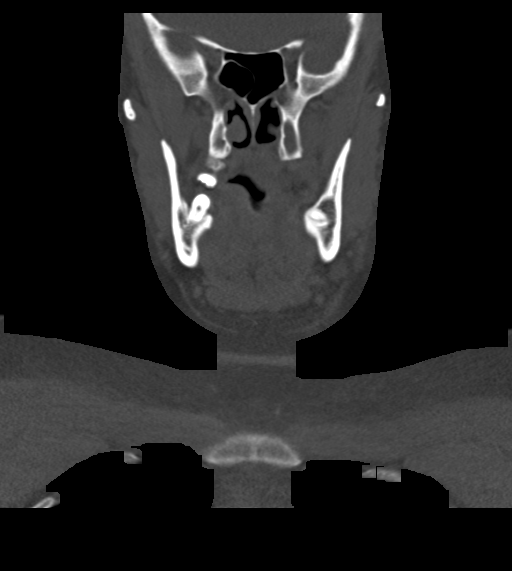
[im 43/99  bone]
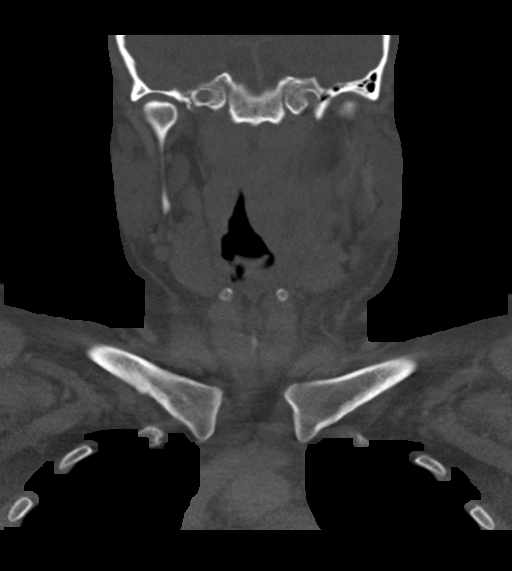
[im 57/99  bone]
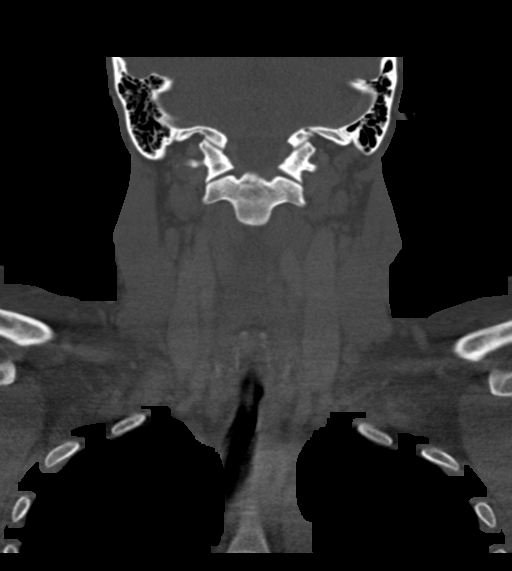

[Series 7: sag neck · sagittal · 0.47mm/px · 5 of 84 slices shown, 6 images]
[im 28/84  bone]
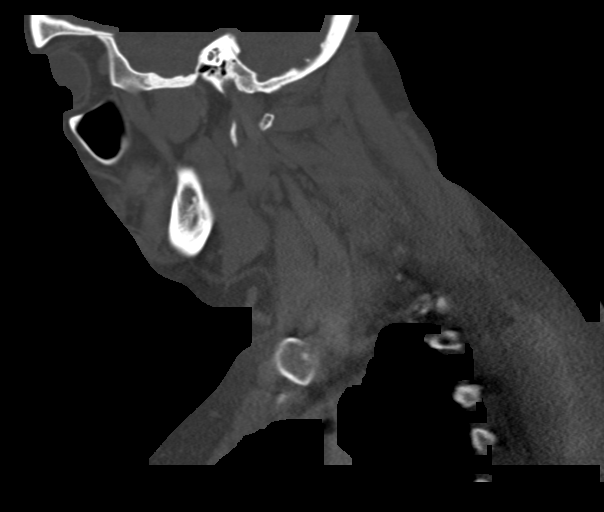
[im 35/84  bone]
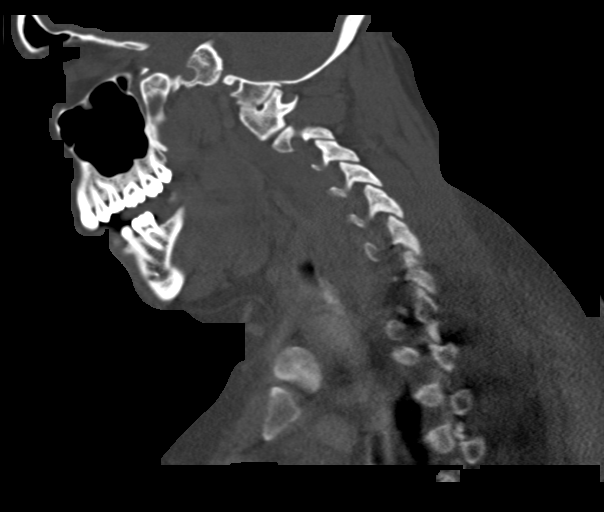
[im 42/84  soft-tissue]
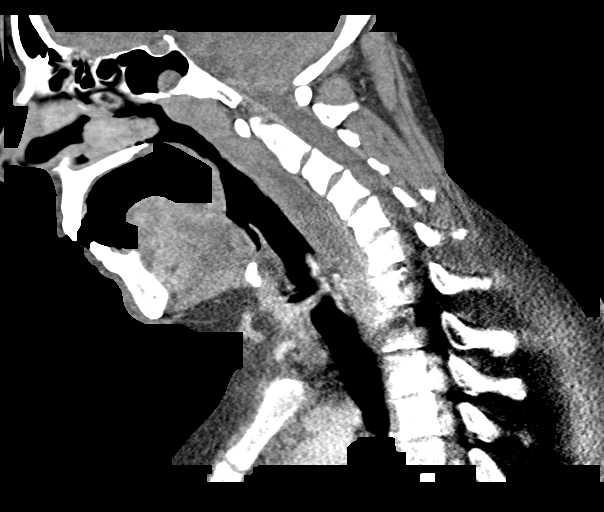
[im 42/84  bone]
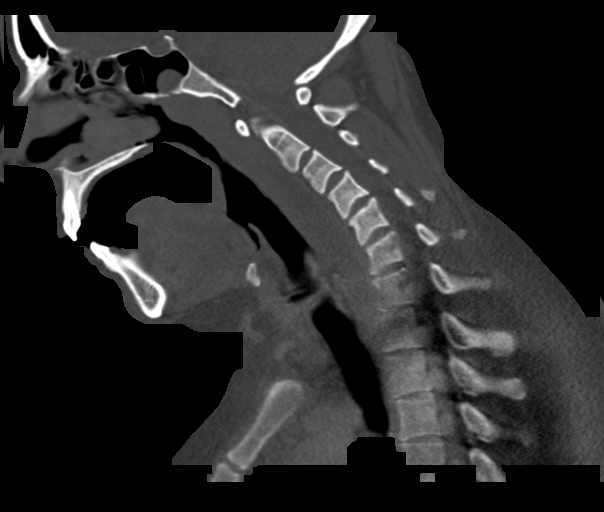
[im 49/84  bone]
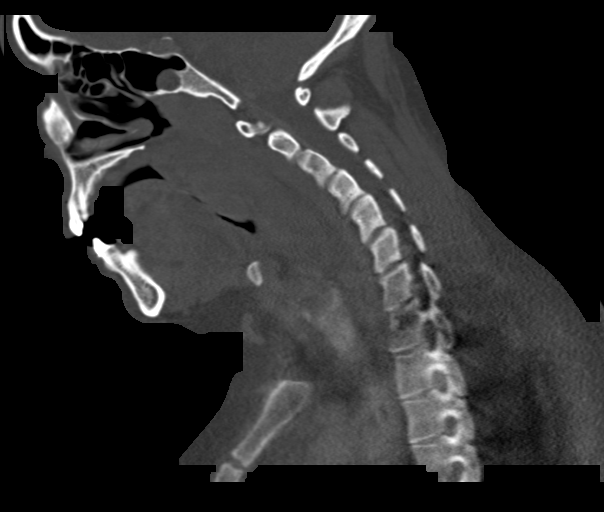
[im 56/84  bone]
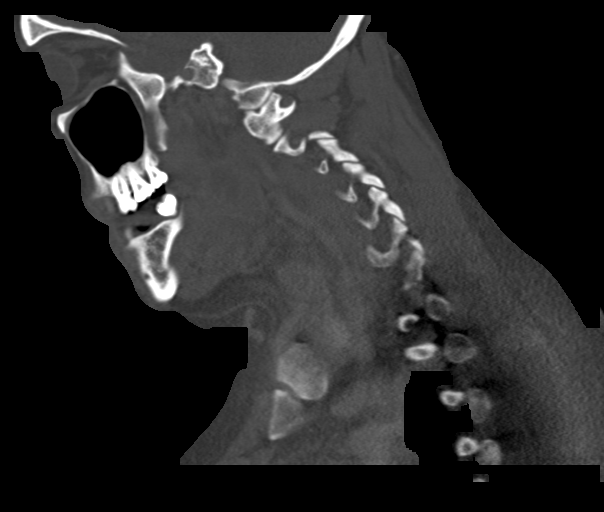

[15 of 33 positions shown; findings below may reference images not displayed]

FINDINGS: Pharynx and larynx: Soft tissue swelling of the left pharyngeal
tonsil. Central hypoenhancing fluid collection measures
approximately 13 x 21 mm compatible with developing abscess. This
has rim enhancement and is not fully liquified at this time. There
is extensive surrounding soft tissue edema extending down to the
piriform sinus. Prior CT revealed similar but more extensive changes
of inflammation in the left tonsil. Retropharyngeal effusion. Normal
epiglottis

Salivary glands: No inflammation, mass, or stone.

Thyroid: Negative

Lymph nodes: No enlarged lymph nodes. Right level 2 lymph node 11
mm. Left level 2 lymph node 11 mm. Small posterior lymph nodes
bilaterally.

Vascular: Negative

Limited intracranial: Negative

Visualized orbits: Mucous retention cyst in the sphenoid sinus.

Mastoids and visualized paranasal sinuses: Remaining sinuses clear

Skeleton: Cervical kyphosis which may be due to tonsillitis versus
positioning.

Upper chest: Negative

Other: None
IMPRESSION: Left tonsillitis with extensive edema in the soft tissues with
partially developed peritonsillar abscess on the left measuring 13 x
21 mm. Pharynx mildly displaced to the right without significant
airway compromise.

## 2023-02-01 ENCOUNTER — Ambulatory Visit
Admit: 2023-02-01 | Discharge: 2023-02-01 | Payer: PRIVATE HEALTH INSURANCE | Attending: Registered Nurse | Admitting: Registered Nurse | Primary: Registered Nurse

## 2023-02-01 VITALS — BP 132/84 | HR 113 | Resp 20 | Ht 64.02 in | Wt 312.4 lb

## 2023-02-01 DIAGNOSIS — Z7689 Persons encountering health services in other specified circumstances: Principal | ICD-10-CM

## 2023-02-01 NOTE — Progress Notes (Signed)
 Kaitlyn Day (DOB:  02-01-1985) is a 38 y.o. female,New patient, here for evaluation of the following chief complaint(s):  New Patient (Has not had a PCP in over 10 years. /) and Health Maintenance (Depression screen completed. /SDOh completed. /)         Assessment & Plan  Encounter to establish care        History of abnormal cervical Pap smear  Referral to GYN for additional work up/eval    Orders:    [facility] [place], [place], DO, Gynecology, [place]     Screening for hyperlipidemia   Orders:    Lipid Panel; Future    Other fatigue   Orders:    CBC with Auto Differential; Future    Comprehensive Metabolic Panel; Future    TSH reflex to FT4; Future    Encounter for screening for HIV   Orders:    HIV Screen; Future    Encounter for hepatitis C screening test for low risk patient   Orders:    Hepatitis C Antibody; Future    Screening for diabetes mellitus   Orders:    Hemoglobin A1C; Future    Snoring   Orders:    Baseline Diagnostic Sleep Study; Future    Arthralgia, unspecified joint   Orders:    Rheumatoid Factor; Future    Sedimentation Rate; Future    ANA Screen With Reflex; Future    CCP Antibodies, IGG/IGA; Future    PCOS (polycystic ovarian syndrome)   Orders:    Insulin, Total; Future      Return in about 4 weeks (around 03/01/2023) for 1 month follow up from est care.       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 Both mom and grandmother had cervical cancer.   Periods have become more regular over the last year. Cycles last 5-7 days, first two days are heavy and then lighten up. Admits she did just recently starting getting menstrual cramps.  Reports body has been rejecting tampons - every time she inserts one it hurts and body will just push it out.    Noticed umbilical hernia 1  month ago  Able to push it back in, sometimes it is tender when she coughs hard. Otherwise it does not bother her.     Worsening fatigue since gaining over 100lbs in the last 5 years  Wakes up with daily migraines - putting ice pack over forehead helps   Snores loudly at night. Mom witnessed her stop breathing several times.     Diffuse joint pain all over body  Mom has RA, would like to be tested for this     There are other unrelated non-urgent complaints, but due to the busy schedule and the amount of time I've already spent with her, time does not permit me to address these routine issues at today's visit. I've requested another appointment to review these additional issues.            Review of Systems   Constitutional:  Positive for fatigue. Negative for activity change, chills and unexpected weight change.   HENT:  Negative for hearing loss, postnasal drip, sinus pressure and sinus pain.    Eyes:  Negative  for pain and visual disturbance.   Respiratory:  Negative for chest tightness and shortness of breath.    Cardiovascular:  Negative for chest pain and palpitations.   Gastrointestinal:  Negative for abdominal pain, constipation, diarrhea, nausea and vomiting.   Endocrine: Negative for polydipsia, polyphagia and polyuria.   Genitourinary:  Positive for menstrual problem. Negative for dysuria, flank pain, frequency and urgency.   Musculoskeletal:  Positive for arthralgias. Negative for back pain and myalgias.   Skin:  Negative for color change and rash.   Neurological:  Negative for dizziness, weakness, light-headedness, numbness and headaches.   Psychiatric/Behavioral:  Negative for confusion, hallucinations, self-injury, sleep disturbance and suicidal ideas. The patient is not nervous/anxious.           Objective   Physical Exam  Vitals and nursing note reviewed.   Constitutional:       Appearance: Normal appearance. She is obese.      Comments: Appears older than stated age    HENT:      Head:  Normocephalic.      Right Ear: Hearing normal.      Left Ear: Hearing normal.      Nose: Nose normal.      Mouth/Throat:      Mouth: Mucous membranes are moist.      Pharynx: Oropharynx is clear.   Eyes:      Extraocular Movements: Extraocular movements intact.      Conjunctiva/sclera: Conjunctivae normal.      Pupils: Pupils are equal, round, and reactive to light.   Cardiovascular:      Rate and Rhythm: Regular rhythm. Tachycardia present.      Pulses: Normal pulses.      Heart sounds: Normal heart sounds, S1 normal and S2 normal.   Pulmonary:      Effort: Pulmonary effort is normal.      Breath sounds: Normal breath sounds.   Abdominal:      General: Abdomen is flat. Bowel sounds are normal.      Palpations: Abdomen is soft.      Hernia: A hernia is present. Hernia is present in the umbilical area.       Musculoskeletal:         General: Normal range of motion.      Cervical back: Normal range of motion.   Skin:     General: Skin is warm and dry.      Capillary Refill: Capillary refill takes less than 2 seconds.   Neurological:      General: No focal deficit present.      Mental Status: She is alert and oriented to person, place, and time. Mental status is at baseline.   Psychiatric:         Mood and Affect: Mood normal.         Behavior: Behavior normal.         Thought Content: Thought content normal.         Judgment: Judgment normal.            Wt Readings from Last 3 Encounters:   02/01/23 (!) 141.7 kg (312 lb 6.4 oz)   06/03/19 117.9 kg (260 lb)   06/01/19 117.9 kg (260 lb)     Temp Readings from Last 3 Encounters:   06/04/19 98.2 F (36.8 C)   06/02/19 97.9 F (36.6 C) (Oral)   12/21/18 97.3 F (36.3 C) (Temporal)     BP Readings from Last 3 Encounters:   02/01/23 132/84  06/04/19 (!) 161/91   06/02/19 120/71     Pulse Readings from Last 3 Encounters:   02/01/23 (!) 113   06/04/19 86   06/02/19 99           An electronic signature was used to authenticate this note.    --Montie Decant, APRN - NP

## 2023-03-05 ENCOUNTER — Encounter: Payer: PRIVATE HEALTH INSURANCE | Attending: Registered Nurse | Primary: Registered Nurse

## 2023-03-19 ENCOUNTER — Encounter
Payer: PRIVATE HEALTH INSURANCE | Attending: Student in an Organized Health Care Education/Training Program | Primary: Registered Nurse

## 2023-04-04 ENCOUNTER — Ambulatory Visit
Admit: 2023-04-04 | Discharge: 2023-04-04 | Payer: PRIVATE HEALTH INSURANCE | Attending: Student in an Organized Health Care Education/Training Program | Primary: Registered Nurse

## 2023-04-04 VITALS — BP 124/90 | HR 101 | Ht 63.0 in | Wt 307.0 lb

## 2023-04-04 DIAGNOSIS — Z01419 Encounter for gynecological examination (general) (routine) without abnormal findings: Secondary | ICD-10-CM

## 2023-04-04 MED ORDER — BENZOYL PEROXIDE WASH 5 % EX LIQD
5 | CUTANEOUS | 3 refills | Status: AC
Start: 2023-04-04 — End: ?

## 2023-04-04 MED ORDER — CLINDAMYCIN PHOSPHATE 1 % EX GEL
1 | CUTANEOUS | 3 refills | Status: AC
Start: 2023-04-04 — End: ?

## 2023-04-04 NOTE — Progress Notes (Signed)
 OB/GYN New Patient Annual Visit  [facility]     Kaitlyn Day  04/04/2023                       Primary Care Physician: Arna Snipe, APRN - NP    CC:   Chief Complaint   Patient presents with    Annual Exam         HPI: Kaitlyn Day is a 38 y.o. female G0P0000 here for a new patient annual visit. She is complaining of stress incontinence symptoms.     Patient's last menstrual period was 03/19/2023. Pt reports history of irregular cycles and PCOS however these have improved and are now regular. They last 5-7 days with some cramping and moderate bleeding the first few days. She is not using anything hormonal. She is not sexually active at this time, but when she was she did have some discomfort with intercourse.    Her bowel habits are regular. She denies any bloating.  She denies dysuria. She complains of stress urinary leaking.  She denies vaginal discharge. She denies any breast complaints.    Depression Screen: Symptoms of decreased mood absent  Symptoms of anhedonia absent    REVIEW OF SYSTEMS:   Constitutional: negative fever, negative chills, negative weight changes   HEENT: negative visual disturbances, negative headaches, negative dizziness, negative hearing loss  Breast: Negative breast abnormalities, negative breast lumps, negative nipple discharge  Respiratory: negative dyspnea, negative cough, negative SOB  Cardiovascular: negative chest pain,  negative palpitations, negative arrhythmia, negative syncope   Gastrointestinal: negative abdominal pain, negative RUQ pain, negative N/V, negative diarrhea, negative constipation, negative bowel changes, negative heartburn   Genitourinary: negative pelvic pain, negative dysuria, negative hematuria, positive urinary incontinence, negative vaginal discharge, negative irregular vaginal bleeding  Dermatological: negative rash, negative pruritis, negative mole or other skin changes  Hematologic: negative bruising  Immunologic/Lymphatic: negative  recent illness, negative recent sick contact  Musculoskeletal: negative back pain, negative myalgias, negative arthralgias  Neurological:  negative dizziness, negative migraines, negative seizures, negative weakness  Behavior/Psych: negative depression, negative anxiety, negative SI, negative HI    ________________________________________________________________________    GYNECOLOGICAL HISTORY:  Age of Menarche: 33  Age of Menopause: n/a     Sexually Active: not sexually active in two years  STD History: denies concern    Pap History: NILM 2013  Hx Abnormal Pap: pt reports excisional procedure about 5-6 years ago    Permanent Sterilization: no  Reversible Birth Control: no    HEALTH MAINTENANCE:  Gardasil immunization: discussed    OBSTETRICAL HISTORY:  OB History   Gravida Para Term Preterm AB Living   0 0 0 0 0 0   SAB IAB Ectopic Molar Multiple Live Births   0 0 0 0 0 0       PAST MEDICAL HISTORY:   has a past medical history of Hernia, abdominal, Polycystic ovary disease, and Severe headache.    PAST SURGICAL HISTORY:   has no past surgical history on file.    ALLERGIES:  has No Known Allergies.    MEDICATIONS:  Prior to Admission medications    Medication Sig Start Date End Date Taking? Authorizing Provider   benzoyl peroxide 5 % external liquid Apply in shower to affected area daily 04/04/23  Yes Alver Fisher, DO   clindamycin (CLINDAGEL) 1 % gel Apply topically daily 04/04/23  Yes Alver Fisher, DO       FAMILY HISTORY:  Family  History of Breast, Ovarian, Colon or Uterine Cancer: Yes pt grandmother and and mother with ovarian cancer   family history includes COPD in her mother; Diabetes in her paternal grandfather; High Blood Pressure in her mother; Hypertension in her father and mother; Other in her paternal grandfather and paternal grandmother; Ovarian Cancer in her maternal grandmother and mother; Rheum Arthritis in her mother.    SOCIAL HISTORY:   reports that she has quit smoking. Her smoking use  included cigarettes. She has never used smokeless tobacco. She reports current alcohol use. She reports that she does not use drugs.      VITALS:  Vitals:    04/04/23 1136   BP: (!) 124/90   Site: Right Upper Arm   Position: Sitting   Cuff Size: Large Adult   Pulse: (!) 101   Weight: (!) 139.3 kg (307 lb)   Height: 1.6 m (5\' 3" )                                                                                                                                                                          PHYSICAL EXAM:   General Appearance: Appears healthy.  Alert; in no acute distress.  Pleasant.  Skin: Skin color, texture, turgor normal. No rashes or lesions.  HEENT: normocephalic and atraumatic  Respiratory: no conversational dyspnea  Cardiovascular: normal rate and regular rhythm  Breast:  (Chest): normal appearance, no masses or tenderness, No nipple retraction or dimpling, No nipple discharge or bleeding, No axillary or supraclavicular adenopathy, evidence of HS under bilateral breasts  Abdomen: soft, obese, small umbilical hernia noted, non-tender, non-distended, no right upper quadrant tenderness, and no CVA tenderness  Pelvic Exam:   Chaperone for Intimate Exam: Chaperone was offered and declined  External genitalia: normal general appearance without lesions, evidence of HS on bilateral groin folds and inner thighs  Urinary system: urethral meatus normal, bladder nontender  Vaginal: normal mucosa, scant discharge  Cervix: normal appearance without discharge or lesions, no CMT  Adnexa: nontender, no masses  Uterus: normal single, mobile, nontender  Musculoskeletal: no gross abnormalities  Extremities: non-tender BLE and non-edematous  Psych:  oriented to time, place and person, mood and affect are within normal limits     ASSESSMENT & PLAN:    Kaitlyn Day is a 38 y.o. female G0P0000 for new patient annual well woman exam   - Vitals stable other than mild HTN  - Gardasil vaccine: discussed, pt will consider,  educational materials provided  - Last pap smear: last available result 2013 and wnl, reports hx excision 5-6 years ago, pap with HPV collected today   - Family Planning: not currently sexuallya ctive   - Family history reviewed   - Recommend  continued yearly well woman exams    Stress Incontinence/Dyspareunia   - Ucx ordered   - Referral to pelvic floor PT provided    Hydradenitis suppurativa   - Rx for peroxide wash and clinda gel provided    Fhx ovarian cancer   - Pt reports mom and grandmother had ovarian cancer and had hysterectomies   - Offered referral to genetics, pt declines today    Patient Active Problem List    Diagnosis Date Noted    Peritonsillar abscess 06/01/2019    Hirsutism 06/27/2011    Other and unspecified ovarian cyst 06/27/2011     replace inactive diagnosis         Return in about 1 year (around 04/03/2024) for well woman exam.    No Patient Care Coordination Note on file.      Counseling Completed:    Counseled about need for pap smears as per American Society for Colposcopy and Cervical Pathology guidelines.  Counseled about birth control.  Counseled about STD counseling and prevention.  Counseled about Gardasil counseling for all patients 28-45 yo.  Counseled about Hereditary Breast, Ovarian, Colon and Uterine Cancer screening.  Routine health maintenance per patients PCP discussed.    Patient was seen with total face to face time of 30 minutes. More than 50% of this visit was on counseling and education regarding the problems listed below and her options. She was also counseled on her preventative health maintenance recommendations and follow-up.   Diagnosis Orders   1. Well woman exam        2. Pap smear, as part of routine gynecological examination  PAP SMEAR      3. Screening for human papillomavirus  PAP SMEAR      4. Family history of ovarian cancer        5. Stress incontinence  Culture, Urine    [facility] [place]      6. Hidradenitis suppurativa  benzoyl peroxide 5 %  external liquid    clindamycin (CLINDAGEL) 1 % gel           Alver Fisher, DO  [facility]  04/04/2023, 1:17 PM

## 2023-12-19 DIAGNOSIS — R1012 Left upper quadrant pain: Secondary | ICD-10-CM

## 2023-12-19 NOTE — ED Notes (Signed)
"  Pt to the ED with c/o hematemesis and abdominal pain x4 quadrants.  Pt reports 4 episodes of emesis today with blood present in the last 2 episodes.  Pt denies blood in stool, but reports diarrhea 3 times yesterday.  Pt denies thinners and denies being around anyone sick.  Pt denies chest pain and SOB, but reports family hx of COPD.    A&Ox4, respirations even and unlabored on RA, ambulatory with steady gait from triage.  "

## 2023-12-19 NOTE — ED Provider Notes (Signed)
"       [facility]   Emergency Department  Emergency Medicine Attending Sign-out   Note started: 11:10 PM EST    Care of Kaitlyn Day was assumed from previous attending Dr. Jabour at 11 PM and is being seen for Abdominal Pain and Hematemesis  .  The patient's initial evaluation and plan have been discussed with the prior provider who initially evaluated the patient.     Attestation  I was available and discussed any additional care issues that arose and coordinated the management plans with the resident(s) caring for the patient during my duty period. Any areas of disagreement with resident's documentation of care or procedures are noted on the chart. I was personally present for the key portions of any/all procedures, during my duty period. I have documented in the chart those procedures where I was not present during the key portions.     BRIEF PATIENT SUMMARY/MDM COURSE PER INITIAL PROVIDER:   RECENT VITALS:     Temp: 98.5 F (36.9 C),  Pulse: 99, Respirations: 18, BP: (!) 156/94, SpO2: 99 %    This patient is a 38 y.o. Female with nausea, vomiting, and diarrhea with abdominal pain.  Had some streaking in the last two boughts of vomiting.  Does have a history of PCOS and lost to follow-up.      DIAGNOSTICS/MEDICATIONS:     MEDICATIONS GIVEN:  ED Medication Orders (From admission, onward)      None            LABS    Labs Reviewed - No data to display    RADIOLOGY  No results found.    OUTSTANDING TASKS / ADDITIONAL COMMENTS   Labs  Symptomatic control    Charmaine Craze, MD  Emergency Medicine Attending  [facility]        Craze Charmaine BRAVO, MD  12/19/23 2312    "

## 2023-12-19 NOTE — ED Provider Notes (Signed)
 "       [facility] EMERGENCY DEPARTMENT  Emergency Department Encounter  Emergency Medicine Resident     Pt Name:Kaitlyn Day  MRN: [id number]  Birthdate 09-26-1985  Date of evaluation: 12/19/23  PCP:  Ethelda Channel, APRN - NP  Note Started: 11:14 PM EST      CHIEF COMPLAINT       Chief Complaint   Patient presents with    Abdominal Pain    Hematemesis       HISTORY OF PRESENT ILLNESS  (Location/Symptom, Timing/Onset, Context/Setting, Quality, Duration, Modifying Factors, Severity.)      Kaitlyn Day is a 38 y.o. female who presents with complaint of nausea, vomiting, abdominal pain.  States she's had 4 episodes of vomiting today with small amount of bloody streaking in the last 2 episodes.  States she is currently not having any nausea but still having some intermittent left upper quadrant abdominal cramping.  Additionally reports she is concerned she may be diabetic as she's had a longstanding history with polyuria and polydipsia.  States she was unable to see a PCP for the past few years due to not having insurance but now has a new job with insurance.  Denies fever, chest pain, shortness of breath, cough, dysuria, hematuria, flank pain, constipation, diarrhea, melena, hematochezia.  Reports occasional alcohol use, marijuana use, occasional cocaine use, denies IV drug use.  Denies tobacco use.    Patient does report a history of GERD, PCOS.      PAST MEDICAL / SURGICAL / SOCIAL / FAMILY HISTORY      has a past medical history of Hernia, abdominal, Polycystic ovary disease, and Severe headache.       has no past surgical history on file.      Social History     Socioeconomic History    Marital status: Single     Spouse name: Not on file    Number of children: Not on file    Years of education: Not on file    Highest education level: Not on file   Occupational History    Not on file   Tobacco Use    Smoking status: Former     Types: Cigarettes    Smokeless tobacco: Never   Vaping Use    Vaping status: Every  Day    Substances: Nicotine   Substance and Sexual Activity    Alcohol use: Yes     Comment: occass    Drug use: No    Sexual activity: Not Currently     Partners: Male   Other Topics Concern    Not on file   Social History Narrative    Not on file     Social Drivers of Health     Financial Resource Strain: Low Risk  (02/01/2023)    Overall Financial Resource Strain (CARDIA)     Difficulty of Paying Living Expenses: Not hard at all   Food Insecurity: No Food Insecurity (04/04/2023)    Hunger Vital Sign     Worried About Running Out of Food in the Last Year: Never true     Ran Out of Food in the Last Year: Never true   Transportation Needs: No Transportation Needs (04/04/2023)    PRAPARE - Therapist, Art (Medical): No     Lack of Transportation (Non-Medical): No   Physical Activity: Not on file   Stress: Not on file   Social Connections: Not on file  Intimate Partner Violence: Unknown (03/23/2022)    Received from [facility]    [place] [employer]     Fear of Current or Ex-Partner: Not on file     Emotionally Abused: Not on file     Physically Abused: Not on file     Sexually Abused: Not on file     Physically or Sexually Abused: Not on file   Housing Stability: Unknown (04/04/2023)    Housing Stability Vital Sign     Unable to Pay for Housing in the Last Year: No     Number of Times Moved in the Last Year: Not on file     Homeless in the Last Year: No       Family History   Problem Relation Age of Onset    Diabetes Paternal Grandfather     Other Paternal Grandfather         mute    Other Paternal Grandmother         mute    Ovarian Cancer Maternal Grandmother     Hypertension Father     Hypertension Mother     High Blood Pressure Mother     Ovarian Cancer Mother     Rheum Arthritis Mother     COPD Mother        Allergies:  Patient has no known allergies.    Home Medications:  Prior to Admission medications   Medication Sig Start Date End Date Taking? Authorizing Provider    benzoyl peroxide  5 % external liquid Apply in shower to affected area daily 04/04/23   Craig, Morgan E, DO   clindamycin  (CLINDAGEL ) 1 % gel Apply topically daily 04/04/23   Craig, Morgan E, DO         REVIEW OF SYSTEMS       Review of Systems   Constitutional:  Negative for chills and fever.   Respiratory:  Negative for cough and shortness of breath.    Cardiovascular:  Negative for chest pain.   Gastrointestinal:  Positive for abdominal pain, nausea and vomiting.   Endocrine: Positive for polydipsia and polyuria.   Genitourinary:  Negative for dysuria, flank pain, hematuria, vaginal bleeding, vaginal discharge and vaginal pain.       PHYSICAL EXAM      INITIAL VITALS:   BP (!) 149/82   Pulse 87   Temp 98.5 F (36.9 C) (Oral)   Resp 18   LMP 11/18/2023 (Approximate)   SpO2 95%     Physical Exam  Vitals and nursing note reviewed.   Constitutional:       General: She is not in acute distress.     Appearance: She is obese. She is not ill-appearing, toxic-appearing or diaphoretic.   HENT:      Head: Normocephalic.   Eyes:      General: No scleral icterus.        Right eye: No discharge.         Left eye: No discharge.   Cardiovascular:      Rate and Rhythm: Normal rate and regular rhythm.      Heart sounds: No murmur heard.     No friction rub. No gallop.   Pulmonary:      Effort: Pulmonary effort is normal. No respiratory distress.      Breath sounds: Normal breath sounds. No stridor. No wheezing, rhonchi or rales.   Abdominal:      General: There is no distension.  Palpations: Abdomen is soft.      Tenderness: There is abdominal tenderness (Minimal) in the left upper quadrant. There is no right CVA tenderness, left CVA tenderness, guarding or rebound. Negative signs include Murphy's sign, Rovsing's sign and McBurney's sign.   Skin:     General: Skin is warm and dry.      Capillary Refill: Capillary refill takes less than 2 seconds.   Neurological:      Mental Status: She is alert.           DDX/DIAGNOSTIC  RESULTS / EMERGENCY DEPARTMENT COURSE / MDM     Medical Decision Making  Amount and/or Complexity of Data Reviewed  External Data Reviewed: labs and notes.  Labs: ordered. Decision-making details documented in ED Course.    Risk  Prescription drug management.        EKG      All EKG's are interpreted by the Emergency Department Physician who either signs or Co-signs this chart in the absence of a cardiologist.    EMERGENCY DEPARTMENT COURSE:  Patient seen and examined.  Vital signs stable.  Patient is overall well-appearing, not acute distress, nontoxic-appearing.  Will obtain basic labs, lipase, serum beta-hCG Qual, UA.  Will give Bentyl  20 mg p.o. and Pepcid  20 mg IV, and reassess.    ED Course as of 12/20/23 0107   Wed Dec 19, 2023   2331 CBC with Auto Differential(!):    WBC 11.2   RBC 5.26(!)   Hemoglobin Quant 15.1   Hematocrit 45.8   MCV 87.1   MCH 28.7   MCHC 33.0   RDW 14.2   Platelet Count 307   MPV 9.8   NRBC Automated 0.0   Neutrophils % 65   Lymphocyte % 26   Monocytes % 8   Eosinophils % 1   Basophils % 0   Immature Granulocytes % 0   Neutrophils Absolute 7.18   Lymphocytes Absolute 2.85   Monocytes Absolute 0.90   Eosinophils Absolute 0.15   Basophils Absolute 0.04   Immature Granulocytes Absolute 0.03  CBC without acute concerns. [CH]   2337 Preg, Serum: NEGATIVE [CH]   2346 Comprehensive Metabolic Panel:    Sodium 137   Potassium 3.8   Chloride 101   CARBON DIOXIDE 25   Anion Gap 11   Glucose 85   BUN,BUNPL 12   Creatinine 0.6   Est, Glom Filt Rate >90   Calcium 9.1   Total Protein 7.3   Albumin 4.0   Albumin/Globulin Ratio 1.2   Total Bilirubin 0.3   Alkaline Phosphatase 89   ALT 10   AST 18  CMP within normal limits. [CH]   2346 Lipase: 23 [CH]   Thu Dec 20, 2023   0102 Urinalysis with Reflex to Culture(!):    Color, UA Yellow   Turbidity UA Cloudy(!)   Glucose, Ur NEGATIVE   Bilirubin, Urine NEGATIVE   Ketones, Urine NEGATIVE   Specific Gravity, UA 1.018   Urine Hgb NEGATIVE   pH, Urine 7.5    Protein, UA NEGATIVE   Urobilinogen 1.0   Nitrite, Urine POSITIVE(!)   Leukocyte Esterase, Urine LARGE(!)  UA significant for large leukocyte esterase and positive nitrites, urine micro pending. [CH]   0106 Patient signed out to Dr. Gasper. [CH]      ED Course User Index  [CH] Lelend Heinecke, Reggy DASEN, MD         CONSULTS:  None    PROCEDURES:      CRITICAL  CARE TIME:  There was significant risk of life threatening deterioration of patient's condition requiring my direct management. Critical care time 0 minutes, excluding any documented procedures.    FINAL IMPRESSION      1. Abdominal pain, left upper quadrant    2. Nausea and vomiting, unspecified vomiting type    3. Urinary frequency          DISPOSITION / PLAN     DISPOSITION                 PATIENT REFERRED TO:  No follow-up provider specified.    DISCHARGE MEDICATIONS:  New Prescriptions    No medications on file       Reggy ONEIDA Kanner, MD  Emergency Medicine Resident    (Please note that portions of thisnote were completed with a voice recognition program.  Efforts were made to edit the dictations but occasionally words are mis-transcribed.)     "

## 2023-12-19 NOTE — ED Provider Notes (Signed)
"                              [facility]     Emergency Department     Faculty Attestation    I performed a history and physical examination of the patient and discussed management with the resident. I have reviewed and agree with the residents findings including all diagnostic interpretations, and treatment plans as written at the time of my review. Any areas of disagreement are noted on the chart. I was personally present for the key portions of any procedures. I have documented in the chart those procedures where I was not present during the key portions. For Physician Assistant/ Nurse Practitioner cases/documentation I have personally evaluated this patient and have completed at least one if not all key elements of the E/M (history, physical exam, and MDM). Additional findings are as noted.    PtNameLatoshia Day  MRN: [id number]  Birthdate 04-17-1985  Date of evaluation: 12/19/23  Note Started: 10:54 PM EST    Primary Care Physician: Ethelda Channel, APRN - NP    Brief HPI:  Patient is a 38 year old female presents emergency department generalized abdominal pain, vomiting, diarrhea.  The patient reports symptoms started yesterday.  2 episodes of diarrhea, nonbloody.  She reports that she had 4 episodes of vomiting today.  The last 2 episodes she noticed some streaks of bright red blood.  She reports generalized abdominal pain    Pertinent Physical Exam Findings:  Vitals:    12/19/23 2226   BP: (!) 156/94   Pulse:    Resp:    Temp:    SpO2:    Appears well, no acute distress, abdomen is soft, nontender.    Medical Decision Making: Patient is a 38 y.o. female presenting to the emergency department with abdominal pain, nausea, vomiting, diarrhea. The chart was reviewed for pertinent history relating to the chief complaint.  The patient appears well at this time in no acute distress, vitals are stable.  Upper GI bleeding is likely Mallory-Weiss tearing.  Plan to obtain abdominal labs and provide  nausea medicine and PPI.     All results, including labs (if ordered), imaging (if ordered), and EKGs (if ordered) were independently interpreted by me.  See radiologist report for additional details on imaging studies.      (Please note that portions of this note were completed with a voice recognition program.  Efforts were made to edit the dictations but occasionally words are mis-transcribed.)    Fairy Labrum, DO, [facility]  Attending Emergency Medicine Physician         Labrum Fairy, DO  12/19/23 2255    "

## 2023-12-19 NOTE — ED Provider Notes (Signed)
 "       [facility] EMERGENCY DEPARTMENT  Emergency Department  Emergency Medicine Resident Turn-Over     Note Started: 1:05 AM EST    Care of Anusha Claus was assumed from Dr. Leverette and is being seen for Abdominal Pain and Hematemesis  .  The patient's initial evaluation and plan have been discussed with the prior provider who initially evaluated the patient.     EMERGENCY DEPARTMENT COURSE / MEDICAL DECISION MAKING:       MEDICATIONS GIVEN:  Orders Placed This Encounter   Medications    dicyclomine  (BENTYL ) capsule 20 mg    famotidine  (PEPCID ) 20 MG/2ML 20 mg in sodium chloride  (PF) 0.9 % 10 mL injection    cephALEXin  (KEFLEX ) capsule 500 mg     Antimicrobial Indications:   Urinary Tract Infection    cephALEXin  (KEFLEX ) 500 MG capsule     Sig: Take 1 capsule by mouth 2 times daily for 7 days     Dispense:  14 capsule     Refill:  0    dicyclomine  (BENTYL ) 10 MG capsule     Sig: Take 1 capsule by mouth 4 times daily (before meals and nightly)     Dispense:  120 capsule     Refill:  0    famotidine  (PEPCID ) 20 MG tablet     Sig: Take 1 tablet by mouth 2 times daily     Dispense:  180 tablet     Refill:  1       LABS / RADIOLOGY:     Labs Reviewed   CBC WITH AUTO DIFFERENTIAL - Abnormal; Notable for the following components:       Result Value    RBC 5.26 (*)     All other components within normal limits   URINALYSIS WITH REFLEX TO CULTURE - Abnormal; Notable for the following components:    Turbidity UA Cloudy (*)     Nitrite, Urine POSITIVE (*)     Leukocyte Esterase, Urine LARGE (*)     All other components within normal limits   MICROSCOPIC URINALYSIS - Abnormal; Notable for the following components:    WBC, UA 51 TO 100 (*)     Bacteria, UA MANY (*)     All other components within normal limits   LIPASE   COMPREHENSIVE METABOLIC PANEL   HCG, SERUM, QUALITATIVE       No results found.    RECENT VITALS:     Temp: 98.5 F (36.9 C),  Pulse: 94, Respirations: 18, BP: (!) 149/82, SpO2: 95 %    This patient  is a 38 y.o. Female with nausea, vomiting, LUQ abdominal pain. Streaks of blood in emesis during last few. Polyuria and polydipsia, worried about diabetes as she's not had insurance for a while. No nausea since arriving here. Given bentyl  and pepcid , feels better. Awaiting urine.     ED Course as of 12/20/23 0231   Wed Dec 19, 2023   2331 CBC with Auto Differential(!):    WBC 11.2   RBC 5.26(!)   Hemoglobin Quant 15.1   Hematocrit 45.8   MCV 87.1   MCH 28.7   MCHC 33.0   RDW 14.2   Platelet Count 307   MPV 9.8   NRBC Automated 0.0   Neutrophils % 65   Lymphocyte % 26   Monocytes % 8   Eosinophils % 1   Basophils % 0   Immature Granulocytes % 0   Neutrophils  Absolute 7.18   Lymphocytes Absolute 2.85   Monocytes Absolute 0.90   Eosinophils Absolute 0.15   Basophils Absolute 0.04   Immature Granulocytes Absolute 0.03  CBC without acute concerns. [CH]   2337 Preg, Serum: NEGATIVE [CH]   2346 Comprehensive Metabolic Panel:    Sodium 137   Potassium 3.8   Chloride 101   CARBON DIOXIDE 25   Anion Gap 11   Glucose 85   BUN,BUNPL 12   Creatinine 0.6   Est, Glom Filt Rate >90   Calcium 9.1   Total Protein 7.3   Albumin 4.0   Albumin/Globulin Ratio 1.2   Total Bilirubin 0.3   Alkaline Phosphatase 89   ALT 10   AST 18  CMP within normal limits. [CH]   2346 Lipase: 23 [CH]   Thu Dec 20, 2023   0102 Urinalysis with Reflex to Culture(!):    Color, UA Yellow   Turbidity UA Cloudy(!)   Glucose, Ur NEGATIVE   Bilirubin, Urine NEGATIVE   Ketones, Urine NEGATIVE   Specific Gravity, UA 1.018   Urine Hgb NEGATIVE   pH, Urine 7.5   Protein, UA NEGATIVE   Urobilinogen 1.0   Nitrite, Urine POSITIVE(!)   Leukocyte Esterase, Urine LARGE(!)  UA significant for large leukocyte esterase and positive nitrites, urine micro pending. [CH]   0106 Patient signed out to Dr. Gasper. [CH]   0107 Assumed care of patient pending micro, discharge [JF]   0200 WBC, UA(!): 51 TO 100 [JF]      ED Course User Index  [CH] Hartsock, Reggy DASEN, MD  [JF] Gasper Harlene SAILOR, DO       OUTSTANDING TASKS / RECOMMENDATIONS:    Follow up urine  Provide w/ PCP followup  Discharge +/- abx     FINAL IMPRESSION:     1. Abdominal pain, left upper quadrant    2. Nausea and vomiting, unspecified vomiting type    3. Urinary frequency    4. Acute UTI        DISPOSITION:         DISPOSITION:  [x]   Discharge   []   Transfer -    []   Admission -     []   Against Medical Advice   []   Eloped   FOLLOW-UP: [facility] Emergency Department  [address]  [place]  [address]  [phone]  Go to   If symptoms worsen     DISCHARGE MEDICATIONS: Discharge Medication List as of 12/20/2023  2:03 AM        START taking these medications    Details   cephALEXin  (KEFLEX ) 500 MG capsule Take 1 capsule by mouth 2 times daily for 7 days, Disp-14 capsule, R-0Normal      dicyclomine  (BENTYL ) 10 MG capsule Take 1 capsule by mouth 4 times daily (before meals and nightly), Disp-120 capsule, R-0Normal      famotidine  (PEPCID ) 20 MG tablet Take 1 tablet by mouth 2 times daily, Disp-180 tablet, R-1Normal                 Harlene SAILOR Gasper, DO  Emergency Medicine Resident  [facility]     "

## 2023-12-20 ENCOUNTER — Inpatient Hospital Stay
Admit: 2023-12-20 | Discharge: 2023-12-20 | Disposition: A | Discharge status: Home / Self Care | Payer: PRIVATE HEALTH INSURANCE | Arrived: WI | Attending: Emergency Medicine

## 2023-12-20 LAB — MICROSCOPIC URINALYSIS: WBC, UA: 51 /HPF — AB

## 2023-12-20 LAB — CBC WITH AUTO DIFFERENTIAL
Basophils %: 0 % (ref 0–2)
Eosinophils Absolute: 0.15 k/uL (ref 0.00–0.44)
Immature Granulocytes %: 0 %
MCH: 28.7 pg (ref 25.2–33.5)
MCHC: 33 g/dL (ref 28.4–34.8)
MCV: 87.1 fL (ref 82.6–102.9)
Monocytes Absolute: 0.9 k/uL (ref 0.10–1.20)
NRBC Automated: 0 /100{WBCs}
Neutrophils %: 65 % (ref 36–65)
Platelets: 307 k/uL (ref 138–453)
WBC: 11.2 k/uL (ref 3.5–11.3)

## 2023-12-20 LAB — URINALYSIS WITH REFLEX TO CULTURE
Bilirubin, Urine: NEGATIVE
Nitrite, Urine: POSITIVE — AB
Specific Gravity, UA: 1.018 (ref 1.003–1.030)
Urine Hgb: NEGATIVE
Urobilinogen, Urine: 1 EU/dL (ref 0.0–1.0)
pH, Urine: 7.5 (ref 5.0–8.0)

## 2023-12-20 LAB — COMPREHENSIVE METABOLIC PANEL
ALT: 10 U/L (ref 10–35)
Anion Gap: 11 mmol/L (ref 9–16)
BUN: 12 mg/dL (ref 6–20)
CO2: 25 mmol/L (ref 20–31)
Creatinine: 0.6 mg/dL (ref 0.6–0.9)
Glucose: 85 mg/dL (ref 74–99)

## 2023-12-20 MED ORDER — FAMOTIDINE 20 MG PO TABS
20 | ORAL_TABLET | Freq: Two times a day (BID) | ORAL | 1 refills | Status: AC
Start: 2023-12-20 — End: ?

## 2023-12-20 MED ORDER — DICYCLOMINE HCL 10 MG PO CAPS
10 | Freq: Once | ORAL | Status: AC
Start: 2023-12-20 — End: 2023-12-20
  Administered 2023-12-20: 05:00:00 20 mg via ORAL

## 2023-12-20 MED ORDER — SODIUM CHLORIDE (PF) 0.9 % IJ SOLN
0.9 | INTRAMUSCULAR | Status: AC
Start: 2023-12-20 — End: 2023-12-20
  Administered 2023-12-20: 05:00:00 20 mg via INTRAVENOUS

## 2023-12-20 MED ORDER — CEPHALEXIN 500 MG PO CAPS
500 | Freq: Once | ORAL | Status: AC
Start: 2023-12-20 — End: 2023-12-20
  Administered 2023-12-20: 07:00:00 500 mg via ORAL

## 2023-12-20 MED ORDER — DICYCLOMINE HCL 10 MG PO CAPS
10 | ORAL_CAPSULE | Freq: Four times a day (QID) | ORAL | 0 refills | Status: AC
Start: 2023-12-20 — End: ?

## 2023-12-20 MED ORDER — CEPHALEXIN 500 MG PO CAPS
500 | ORAL_CAPSULE | Freq: Two times a day (BID) | ORAL | 0 refills | Status: AC
Start: 2023-12-20 — End: 2023-12-27

## 2023-12-20 MED FILL — DICYCLOMINE HCL 10 MG PO CAPS: 10 mg | ORAL | Qty: 2

## 2023-12-20 MED FILL — CEPHALEXIN 500 MG PO CAPS: 500 mg | ORAL | Qty: 1

## 2023-12-20 MED FILL — FAMOTIDINE (PF) 20 MG/2ML IV SOLN: 20 MG/2ML | INTRAVENOUS | Qty: 2

## 2023-12-20 NOTE — ED Notes (Signed)
 The following labs were labeled with appropriate pt sticker and tubed to lab:     []  Blue     []  Lavender   []  on ice  []  Green/yellow  []  Green/black []  on ice  []  Grey  []  on ice  []  Yellow  []  Red  []  Pink  []  Type/ Screen  []  ABG  []  VBG    []  COVID-19 swab    []  Rapid  []  PCR  []  Flu swab  []  Peds Viral Panel     [x]  Urine Sample  []  Fecal Sample  []  Pelvic Cultures  []  Blood Cultures  []  X 2  []  STREP Cultures  []  Wound Cultures

## 2023-12-20 NOTE — ED Notes (Signed)
"  Pt placed on 2L NC for SPO2 84% on RA while asleep.  "

## 2023-12-20 NOTE — Discharge Instructions (Addendum)
"  You were seen in the ER today for abdominal pain, urinary frequency.  You do have a UTI.    You are being given a course of antibiotics, please take all of them even if you begin to feel better.  You should not have any antibiotics leftover. If you do not take all of them there is risk of antibiotic resistance and difficulty treating infections in the future.     You will also be sent medications to help with abdominal pain.  You have been provided with information for local primary care doctors, please establish care and follow-up with them for long-term outpatient management of any chronic problems.  Return to the ER if new or worsening symptoms or any other concerns.  "

## 2023-12-20 NOTE — ED Notes (Signed)
 Pt ambulated to restroom with steady gait.
